# Patient Record
Sex: Female | Born: 1935 | Race: White | Hispanic: No | Marital: Married | Smoking: Never smoker
Health system: Southern US, Community
[De-identification: ages and names within clinical notes are randomized; demographics above are authoritative.]

## PROBLEM LIST (undated history)

## (undated) DIAGNOSIS — I10 Essential (primary) hypertension: Secondary | ICD-10-CM

## (undated) DIAGNOSIS — N390 Urinary tract infection, site not specified: Secondary | ICD-10-CM

## (undated) DIAGNOSIS — R279 Unspecified lack of coordination: Secondary | ICD-10-CM

## (undated) DIAGNOSIS — F0280 Dementia in other diseases classified elsewhere without behavioral disturbance: Secondary | ICD-10-CM

## (undated) DIAGNOSIS — K5190 Ulcerative colitis, unspecified, without complications: Secondary | ICD-10-CM

## (undated) DIAGNOSIS — M1990 Unspecified osteoarthritis, unspecified site: Secondary | ICD-10-CM

## (undated) DIAGNOSIS — E870 Hyperosmolality and hypernatremia: Secondary | ICD-10-CM

## (undated) DIAGNOSIS — M6281 Muscle weakness (generalized): Secondary | ICD-10-CM

## (undated) DIAGNOSIS — K219 Gastro-esophageal reflux disease without esophagitis: Secondary | ICD-10-CM

## (undated) HISTORY — DX: Unspecified osteoarthritis, unspecified site: M19.90

## (undated) HISTORY — DX: Urinary tract infection, site not specified: N39.0

## (undated) HISTORY — DX: Dementia in other diseases classified elsewhere, unspecified severity, without behavioral disturbance, psychotic disturbance, mood disturbance, and anxiety: F02.80

## (undated) HISTORY — DX: Ulcerative colitis, unspecified, without complications: K51.90

## (undated) HISTORY — PX: TONSILLECTOMY: SUR1361

## (undated) HISTORY — DX: Essential (primary) hypertension: I10

## (undated) HISTORY — DX: Unspecified lack of coordination: R27.9

## (undated) HISTORY — DX: Gastro-esophageal reflux disease without esophagitis: K21.9

## (undated) HISTORY — DX: Hyperosmolality and hypernatremia: E87.0

## (undated) HISTORY — DX: Muscle weakness (generalized): M62.81

## (undated) HISTORY — PX: OTHER SURGICAL HISTORY: SHX169

---

## 2000-08-29 HISTORY — PX: OTHER SURGICAL HISTORY: SHX169

## 2011-08-17 ENCOUNTER — Inpatient Hospital Stay
Admission: RE | Admit: 2011-08-17 | Discharge: 2015-11-19 | Disposition: A | Discharge status: Other Acute Inpatient Hospital | Payer: Self-pay | Source: Ambulatory Visit | Attending: Internal Medicine | Admitting: Internal Medicine

## 2011-08-17 DIAGNOSIS — R6251 Failure to thrive (child): Principal | ICD-10-CM

## 2012-11-30 ENCOUNTER — Non-Acute Institutional Stay (SKILLED_NURSING_FACILITY): Payer: Medicare Other | Admitting: Internal Medicine

## 2012-11-30 DIAGNOSIS — I10 Essential (primary) hypertension: Secondary | ICD-10-CM

## 2012-11-30 DIAGNOSIS — F0280 Dementia in other diseases classified elsewhere without behavioral disturbance: Secondary | ICD-10-CM

## 2012-11-30 DIAGNOSIS — H6120 Impacted cerumen, unspecified ear: Secondary | ICD-10-CM

## 2012-11-30 DIAGNOSIS — K219 Gastro-esophageal reflux disease without esophagitis: Secondary | ICD-10-CM

## 2012-11-30 DIAGNOSIS — G3183 Dementia with Lewy bodies: Secondary | ICD-10-CM

## 2012-11-30 DIAGNOSIS — H6123 Impacted cerumen, bilateral: Secondary | ICD-10-CM

## 2012-11-30 DIAGNOSIS — D649 Anemia, unspecified: Secondary | ICD-10-CM | POA: Insufficient documentation

## 2012-11-30 DIAGNOSIS — K5190 Ulcerative colitis, unspecified, without complications: Secondary | ICD-10-CM

## 2012-11-30 NOTE — Progress Notes (Signed)
Patient ID: Theresa Ford, female   DOB: [birth year], 79 y.o.   MRN: [id number] Chief complaint medical management of dementia hypertension ulcerative colitis GERD anemia-acute visit secondary to question wax buildup in the ears.  History of present illness.  Patient is a pleasant elderly resident with a history of significant dementia.  Nonetheless her stay here has been quite unremarkable and uneventful.  She does have severe dementia-Aricept and Namenda have been discontinued in the past because  thought not to be effective  She continues to be fairly remarkably stable.  Does have a history of ulcerative colitis-she is on medication for this and this has not really been an issue.  Also listed history of hypertension but she is not on any medications and blood pressures appear to be well controlled most recently 139/75-113/71 in this range-her weight continues to be relatively stable  She did have some weight gain up till earlier this year she has lost a bit of weight the last couple months -- we will have to keep an eye on this the baseline appears to be low to mid 160s currently 161.4  Most acute issue has been apparently some increased difficulty hearing-her husband who is very attentive think she may have some ear wax and I will look into this.  Patient does not complaining of any ear pain shortness of breath or chest pain-nursing staff does not report any of this either-apparently she eats fairly well.  Family medical social history has been reviewed per history and physical on 08/24/2011.  Medications reviewed per MAR.  Review of systems-stated in history of present illness again largely un attainable secondary to severe dementia  Physical exam.  Temperature is 97.4 pulse 68 respirations 18 blood pressure 139/75-113/71-weight is 161.4.  General this is a frail but fairly vigorous elderly female in no distress sitting comfortably in her wheelchair.  Her skin is warm and dry-she  does appear to have numerous seb  keratoses which is baseline  Eyes pupils empirical round reactive to light sclera and conjunctiva are clear.  Oropharynx somewhat limited exam since patient did not really open her mouth very wide but mucous membranes do appear moist-I do not see any evidence of a film on the tongue etc  Ears-there appears to be wax obstructing the tympanic membrane in both ears- I do not see any erythema or drainage.  Chest -- clear to auscultation without rhonchi rales or wheezes no labored breathing.  There is somewhat poor respiratory effort.  Heart is regular rate and rhythm without murmur gallop or rub.  Abdomen soft nontender positive bowel sounds.  Muscle skeletal-ambulating in a wheelchair-she does actually walk at times with assistance-.  Neurologic as she does have severe dementia-cranial nerves appear grossly intact-I do not see any lateralizing findings.  Psych-again she does have severe dementia-has difficulty following verbal commands.  Pleasant smiling which appears to be her baseline.  Labs.  10/04/2012.  CBC 8.0 hemoglobin 12.5 platelets 307.  Sodium 139 potassium 4 BUN 14 creatinine 0.90.  06/18/1911.  Liver function tests within normal limits    Assessment and plan.  #1-dementia-this continues to be at baseline-she is functioning well in this setting-has a very supportive family  #2-hypertension-this appears stable currently on no medications.  #3 ulcerative colitis-continues to be stable on current medicine.  #4 GERD-continues on Prilosec this does not appear to be an issue recently.  #5-allergic rhinitis -continues on Claritin.--This is stable and Claritin appears to help.  #6-ear impaction-Will treat with the Debrox  10 drops each ear twice a day for 3 days and then flush with warm water on day 4  If  -this is not effective consider ENT consult  [phone]

## 2013-01-02 ENCOUNTER — Non-Acute Institutional Stay (SKILLED_NURSING_FACILITY): Payer: Medicare Other | Admitting: Internal Medicine

## 2013-01-02 DIAGNOSIS — R339 Retention of urine, unspecified: Secondary | ICD-10-CM

## 2013-01-07 ENCOUNTER — Non-Acute Institutional Stay (SKILLED_NURSING_FACILITY): Payer: Medicare Other | Admitting: Internal Medicine

## 2013-01-07 DIAGNOSIS — R339 Retention of urine, unspecified: Secondary | ICD-10-CM

## 2013-01-07 DIAGNOSIS — K5641 Fecal impaction: Secondary | ICD-10-CM

## 2013-01-07 NOTE — Progress Notes (Signed)
Patient ID: Theresa Ford, female   DOB: 30-May-1927, 79 y.o.   MRN: [id number] Chief complaint; followup urinary retention.  History; Mrs.-looking was recently discovered by the staff to have difficulty voiding. She had a Foley catheter placed last weel. Lateral sound showed over 400 cc. There was a urine culture done that was apparently negative although I can't see that result. When I examined her last week. I discovered her to have a moderate fecal impaction, which I have cleared, she is now at the point at to see if she if we can consider pulling the catheter.  Review of systems; not possible from the patient however, nursing reports she has been having a good series of bowel movements  Physical exam; GI still somewhat distended, but no masses, bowel. sounds are positive. No liver no spleen. GU Foley catheter is employed. Rectal I did not repeat the exam today, over. She is apparently been having good bowel movements on regular Dulcolax suppository.   Impression/plan #1 urinary retention; hopefully, secondary to fecal impaction now resolved. I'm going to pull the Foley catheter tomorrow for a voiding trial. Ultimately, she will need probably a repeat bladder ultrasound. #2 fecal impaction hopefully resolve, and hopefully, the cause of the urinary retention.

## 2013-01-08 NOTE — Progress Notes (Signed)
Patient ID: Theresa Ford, female   DOB: 1926/11/08, 79 y.o.   MRN: [id number]          PROGRESS NOTE  DATE:  01/02/2013  FACILITY: [facility]   LEVEL OF CARE:   SNF   Acute Visit   CHIEF COMPLAINT:  Urinary retention.   HISTORY OF PRESENT ILLNESS:   Apparently, the patient was noted not to void.  She had a Foley catheter placed and over 600 cc of urine was obtained.  The urinalysis looks fairly benign.  A urine culture is pending.  The patient is really too demented to provide further history.  She does not have an overt history of urinary retention.  She is not on any of the usual offending medications.    REVIEW OF SYSTEMS:  Not otherwise possible.  She is not febrile.   PHYSICAL EXAMINATION:   GASTROINTESTINAL:  ABDOMEN:  Distended, although I feel no clear masses.     RECTAL:  Notable for a large amount of soft stool.   GENITOURINARY:  BLADDER:   She has a Foley catheter in place.  There is no clear suprapubic or costovertebral angle tenderness.    ASSESSMENT/PLAN:  Urinary retention.  The cause of this is not clear.  We are awaiting a urine culture.  She does have a moderate amount of soft stool.  Occasionally, I have seen fecal impaction cause urinary retention in older people and I will clear this out.  She is on none of the usual medication offenders.  I will wait for the culture result, clear the impaction, and then attempt to pull the Foley catheter.  Failing this, she may need to see Urology.  CPT CODE: 40981

## 2013-02-05 ENCOUNTER — Non-Acute Institutional Stay (SKILLED_NURSING_FACILITY): Payer: Medicare Other | Admitting: Internal Medicine

## 2013-02-05 DIAGNOSIS — F0280 Dementia in other diseases classified elsewhere without behavioral disturbance: Secondary | ICD-10-CM

## 2013-02-05 DIAGNOSIS — K5190 Ulcerative colitis, unspecified, without complications: Secondary | ICD-10-CM

## 2013-02-05 DIAGNOSIS — K219 Gastro-esophageal reflux disease without esophagitis: Secondary | ICD-10-CM

## 2013-02-05 DIAGNOSIS — R34 Anuria and oliguria: Secondary | ICD-10-CM | POA: Insufficient documentation

## 2013-02-05 DIAGNOSIS — I10 Essential (primary) hypertension: Secondary | ICD-10-CM

## 2013-02-05 DIAGNOSIS — G309 Alzheimer's disease, unspecified: Secondary | ICD-10-CM | POA: Insufficient documentation

## 2013-02-05 DIAGNOSIS — D649 Anemia, unspecified: Secondary | ICD-10-CM

## 2013-02-05 NOTE — Progress Notes (Signed)
Patient ID: Theresa Ford, female   DOB: 10-03-1926, 79 y.o.   MRN: [id number] This is a routine visit-acute visit.  Level of care skilled.  Facility [facility].   Chief complaint medical management of dementia hypertension ulcerative colitis GERD anemia-acute visit secondary to decreased urine output today .  History of present illness.  Patient is a pleasant elderly resident with a history of significant dementia.  Nonetheless her stay here has been quite unremarkable and uneventful.   She does have severe dementia-Aricept and Namenda have been discontinued in the past because thought not to be effective  She continues to be fairly remarkably stable.  Does have a history of ulcerative colitis-she is on medication for this and this has not really been an issue.  Also listed history of hypertension but she is not on any medications and blood pressures appear to be well controlled most recently 139/85-113/71  in this range-her weight continues to be relatively stable  She did have some weight gain up till earlier this year she has lost a bit of weight the last couple months -- we will have to keep an eye on this the baseline appears to be low to mid 160s currently 164  Most acute issue has been apparently some decreased urinary output today-her tech says she hasn't had much at all she usually does. She is not complaining of any acute dysuria or suprapubic pain although she is a poor historian    .  Patient does not complaining of any ear pain shortness of breath or chest pain-nursing staff does not report any of this either-apparently she eats fairly well. And is having regular bowel movements-apparently had been urinating quite well up till this morning   Family medical social history has been reviewed per history and physical on 08/24/2011.   Medications reviewed per MAR.   Review of systems-stated in history of present illness again largely un attainable secondary to severe dementia    Physical exam She is afebrile pulse is 72 respirations 18 blood pressure 139/85-123/80-113/71-weight is stable at 164.  Marland Kitchen  General this is a frail but fairly vigorous elderly female in no distress--lying comfortably in bed.  Her skin is warm and dry-she does appear to have numerous seb keratoses which is baseline  Eyes pupils empirical round reactive to light sclera and conjunctiva are clear.  Oropharynx somewhat limited exam since patient did not really open her mouth very wide but mucous membranes do appear moist-I do not see any evidence of a film on the tongue etc   .  Chest -- clear to auscultation without rhonchi rales or wheezes no labored breathing.  There is somewhat poor respiratory effort.  Heart is regular rate and rhythm without murmur gallop or rub.  Abdomen soft nontender positive bowel sounds.  GU-there appears to possibly be some mild suprapubic distention-I do not really appreciate a lot of tenderness here Muscle skeletal-do not note any deformities--she does actually walk at times with assistance-.  Neurologic as she does have severe dementia-cranial nerves appear grossly intact-I do not see any lateralizing findings.  Psych-again she does have severe dementia-has difficulty following verbal commands.  Pleasant smiling which appears to be her baseline .  Labs.  10/04/2012.  CBC 8.0 hemoglobin 12.5 platelets 307.  Sodium 139 potassium 4 BUN 14 creatinine 0.90.  06/18/2012.  Liver function tests within normal limits   Assessment and plan.  #1-dementia-this continues to be at baseline-she is functioning well in this setting-has a very supportive family  #  2-hypertension-this appears stable currently on no medications.  #3 ulcerative colitis-continues to be stable on current medicine.  #4 GERD-continues on Prilosec this does not appear to be an issue recently.  #5-allergic rhinitis -continues on Claritin.--This is stable and Claritin appears to help.  #6-question  urinary retention-will order an in and out cath and keep catheter in if greater than 250 cc obtained-also check a UA Cand S Of note also will obtain updated lab work including a CBC as well as CMP  Addendum-nursing did insert a catheter and obtained  greater than 300 cc of urine-for now will keep catheter in and obtain lab work as noted above    [id number]

## 2013-02-06 ENCOUNTER — Non-Acute Institutional Stay (SKILLED_NURSING_FACILITY): Payer: Medicare Other | Admitting: Internal Medicine

## 2013-02-06 DIAGNOSIS — R339 Retention of urine, unspecified: Secondary | ICD-10-CM

## 2013-02-08 ENCOUNTER — Non-Acute Institutional Stay (SKILLED_NURSING_FACILITY): Payer: Medicare Other | Admitting: Internal Medicine

## 2013-02-08 DIAGNOSIS — N390 Urinary tract infection, site not specified: Secondary | ICD-10-CM

## 2013-02-08 DIAGNOSIS — R34 Anuria and oliguria: Secondary | ICD-10-CM

## 2013-02-08 NOTE — Progress Notes (Signed)
Patient ID: Theresa Ford, female   DOB: 07/01/1927, 79 y.o.   MRN: [id number]  This is an acute visit.  Level of care skilled.  Facilities [facility].  Chief complaint-acute visit followup urinary retention-UTI.  History of present illness.  Patient is a pleasant elderly resident with a history of significant dementia.  I saw her earlier this week secondary to staff noting decreased urinary output-an in and out cath did yield  300 cc of urine--an indwelling Foley catheter was inserted and a urine culture was obtained which has grown out 85,000 colonies of Escherichia coli.  She has been afebrile according to nursing staff she appears to be at her baseline which is confused but pleasantly so.  Family medical social history has been reviewed her history and physical on 08/24/2011.  Medications have been reviewed per MAR.  Review of systems-please see history of present illness essentially unattainable secondary to severe dementia.  Physical exam.  He is afebrile pulse 72 respirations of 18.  In general this is a frail elderly female in no distress lying comfortably in bed.  Skin is warm and dry she does have numerous seborrheic keratoses.  Heart is regular rate and rhythm without murmur gallop or rub.  Chest is clear to auscultation with somewhat poor respiratory effort.  Abdomen soft nontender with positive bowel sounds.  GU-could not really appreciate any distention this time --some mild suprapubic tenderness.  Foley catheter is draining amber colored urine.  Labs.  Urine culture did grow out 85,000 colonies of Escherichia coli.  02/06/2013.  WBC 5.4 hemoglobin 12.5 platelets 253.  Sodium 140 potassium 3.7 BUN 14 creatinine 0.90.  Liver function tests within normal limits except albumin of 3.4.  Assessment and plan.  #1-urinary retention with possible UTI-patient appears to be somewhat symptomatic here with recent urinary retention and  mild suprapubic tenderness-Will  treat with a short course of Macrobid 100 mg twice a day for 7 days-also her husband is quite concerned about the catheter and would like it removed as soon as possible-will write order to remove this tomorrow and do a bladder scan post void--certainly monitor for any continued retention.  [id number].  Marland Kitchen

## 2013-02-18 NOTE — Progress Notes (Signed)
Patient ID: Theresa Ford, female   DOB: [birth year], 79 y.o.   MRN: [id number]           PROGRESS NOTE  DATE:  02/06/2013  FACILITY: [facility]   LEVEL OF CARE:   SNF   Acute Visit   CHIEF COMPLAINT:  Urinary retention.    HISTORY OF PRESENT ILLNESS:   About a month ago, I saw Theresa Ford when she was noted by the staff to have trouble voiding.  She had a Foley catheter placed.  Bladder ultrasound showed over 400 cc.  There was a urine culture done that was negative.  Examination at that time suggested fecal impaction.  Although this is, in my experience, a rare cause for the urinary retention, we cleared that.  She had the Foley catheter removed and she appeared to be voiding adequately.  Unfortunately, it does not appear that I reordered a bladder ultrasound after this event.  In any case, on 02/05/2013, she was noted to have not voided during an entire shift.  A Foley catheter was inserted.  She  apparently had 300 cc which is not a tremendous amount, and it was not really clear that she was uncomfortable.  Paradoxically, we have had recent lab work that does not show any evidence of dehydration.  Her CBC, comprehensive metabolic panel are normal other than an albumin of 3.4.    PHYSICAL EXAMINATION:   GENERAL APPEARANCE:  The patient is not in any distress.   CARDIOVASCULAR:  CARDIAC:   Heart sounds are normal.  She does not appear to be dehydrated.   GASTROINTESTINAL:  LIVER/SPLEEN/KIDNEYS:  No liver, no spleen.  ABDOMEN:  No masses, although she is somewhat distended.   GENITOURINARY:  BLADDER:   No suprapubic or costovertebral angle tenderness.  Foley catheter is in place.   RECTAL:  Exam showed an empty rectal vault.  She is not impacted.    ASSESSMENT/PLAN:  Question urinary retention.  300 cc does not diagnose urinary retention.  At some point, this is going to need to be pulled and give her a chance to see if she can void with a bladder ultrasound after the  catheter is out.  I will also send another urine for C&S today.  CPT CODE: 40981

## 2013-03-14 ENCOUNTER — Non-Acute Institutional Stay (SKILLED_NURSING_FACILITY): Payer: Medicare Other | Admitting: Internal Medicine

## 2013-03-14 DIAGNOSIS — L309 Dermatitis, unspecified: Secondary | ICD-10-CM

## 2013-03-14 DIAGNOSIS — D649 Anemia, unspecified: Secondary | ICD-10-CM

## 2013-03-14 DIAGNOSIS — I10 Essential (primary) hypertension: Secondary | ICD-10-CM

## 2013-03-14 DIAGNOSIS — K5190 Ulcerative colitis, unspecified, without complications: Secondary | ICD-10-CM

## 2013-03-14 DIAGNOSIS — R34 Anuria and oliguria: Secondary | ICD-10-CM

## 2013-03-14 DIAGNOSIS — K219 Gastro-esophageal reflux disease without esophagitis: Secondary | ICD-10-CM

## 2013-03-14 DIAGNOSIS — F0280 Dementia in other diseases classified elsewhere without behavioral disturbance: Secondary | ICD-10-CM

## 2013-03-14 DIAGNOSIS — L259 Unspecified contact dermatitis, unspecified cause: Secondary | ICD-10-CM

## 2013-03-14 DIAGNOSIS — G309 Alzheimer's disease, unspecified: Secondary | ICD-10-CM

## 2013-03-14 NOTE — Progress Notes (Signed)
Patient ID: Theresa Ford, female   DOB: September 25, 1926, 79 y.o.   MRN: [id number]                     Chief complaint medical management of dementia hypertension ulcerative colitis GERD anemia  Urinary retention...-acute visit secondary to buttock rash... .   History of present illness.   Patient is a pleasant elderly resident with a history of significant dementia.   Nonetheless her stay here has been quite unremarkable and uneventful. More recently she did develop some urinary retention--she has had a Foley catheter at times-and was treated for a UTI.  She currently does not have a catheter according to nursing staff and family she does retain her urine for significant parts of the day and then will have a very large amount of urinary output-    She does have severe dementia-Aricept and Namenda have been discontinued in the past because thought not to be effective   She also has developed apparently a well demarcated erythematous area on her left buttocks-this has been assessed by wound care and appears to steroid cream was started yesterday-according to nursing today this is looking a bit better.   Does have a history of ulcerative colitis-she is on medication for this and this has not really been an issue.   Also listed history of hypertension but she is not on any medications and blood pressures appear to be well controlled most recently 136/84-118/73-151/85-does not appear to have any consistent elevations  in -her weight continues to be relatively stable   She did have some weight gain up till earlier this year she has lost a bit of weight the last couple months -- we will have to keep an eye on this the baseline appears to be low to mid 160s currently 161    . She is not complaining of any acute dysuria or suprapubic pain although she is a poor historian           Family medical social history has been reviewed per history and physical on 08/24/2011.    Medications  reviewed per MAR.    Review of systems-stated in history of present illness again largely un attainable secondary to severe dementia    Physical exam  She is afebrile pulse 78 respirations 20 blood pressure as noted in history of present illness weight is relatively stable at 161.   Marland Kitchen   General this is a frail but fairly vigorous elderly female in no distress--lying comfortably in bed.   Her skin is warm and dry-she does appear to have numerous seb keratoses which is baseline Left buttocks there is a well demarcated somewhat pale erythematous area there is no firmness pain or elevation to this-there is no warmth-   Eyes pupils empirical round reactive to light sclera and conjunctiva are clear.   Oropharynx somewhat limited exam since patient did not really open her mouth very wide but mucous membranes do appear moist-I do not see any evidence of a film on the tongue etc    .   Chest -- clear to auscultation without rhonchi rales or wheezes no labored breathing.   There is somewhat poor respiratory effort.   Heart is regular rate and rhythm without murmur gallop or rub.   Abdomen soft nontender positive bowel sounds.   GU-does not appear to have any suprapubic tenderness or gross distention Muscle skeletal-do not note any deformities--she does actually walk at times with assistance-.   Neurologic as she  does have severe dementia-cranial nerves appear grossly intact-I do not see any lateralizing findings.   Psych-again she does have severe dementia-has difficulty following verbal commands.   Pleasant smiling which appears to be her baseline .   Labs.    10/04/2012.   CBC 8.0 hemoglobin 12.5 platelets 307.   Sodium 139 potassium 4 BUN 14 creatinine 0.90.   06/18/2012.   Liver function tests within normal limits    Assessment and plan.   #1-dementia-this continues to be at baseline-she is functioning well in this setting-has a very supportive family   #2-hypertension-this appears  stable currently on no medications.   #3 ulcerative colitis-continues to be stable on Sulfasalazine    #4 GERD-continues on Prilosec this does not appear to be an issue recently.   #5-allergic rhinitis -continues on Claritin.--This is stable and Claritin appears to help.   #6-question urinary retention-apparently she is voiding although apparently she does this at sporadic times-her family really desires her not to have a Foley if at all possible-we'll continue to monitor- #7-dermatitis unspecified-at this point we'll continue the steroid cream since apparently this is improving-however if this is not resolved fairly quickly would switch to an antifungal  Of note also will obtain updated lab work including a CBC as well as CMP       [id number]

## 2013-06-13 ENCOUNTER — Non-Acute Institutional Stay (SKILLED_NURSING_FACILITY): Payer: Medicare Other | Admitting: Internal Medicine

## 2013-06-13 DIAGNOSIS — D649 Anemia, unspecified: Secondary | ICD-10-CM

## 2013-06-13 DIAGNOSIS — R350 Frequency of micturition: Secondary | ICD-10-CM

## 2013-06-13 DIAGNOSIS — K219 Gastro-esophageal reflux disease without esophagitis: Secondary | ICD-10-CM

## 2013-06-13 DIAGNOSIS — I10 Essential (primary) hypertension: Secondary | ICD-10-CM

## 2013-06-13 DIAGNOSIS — N390 Urinary tract infection, site not specified: Secondary | ICD-10-CM

## 2013-06-13 DIAGNOSIS — K5190 Ulcerative colitis, unspecified, without complications: Secondary | ICD-10-CM

## 2013-06-13 DIAGNOSIS — F0280 Dementia in other diseases classified elsewhere without behavioral disturbance: Secondary | ICD-10-CM

## 2013-06-13 NOTE — Progress Notes (Signed)
Patient ID: Theresa Ford, female   DOB: March 28, 1927, 79 y.o.   MRN: [id number]    this is a routine visit.  Level of care skilled.  Facility [facility].   Chief complaint medical management of dementia hypertension ulcerative colitis GERD anemia Urinary retention...-acute visit secondary urinary frequency -UTI...  .  History of present illness.  Patient is a pleasant elderly resident with a history of significant dementia.  Nonetheless her stay here has been quite unremarkable and uneventful.  More recently she did develop some urinary retention--she has had a Foley catheter at times-and was treated for a UTI.--According to nursing staff she has been voiding fairly freely for some time now.  However she has had some increased frequency recently and a urine culture was obtained which has grown out greater than 100,000 colonies of Escherichia coli.   -  She does have severe dementia-Aricept and Namenda have been discontinued in the past because thought not to be effective   .  Does have a history of ulcerative colitis-she is on medication for this and this has not really been an issue.  Also listed history of hypertension but she is not on any medications and blood pressure  appears to be well controlled I got 120/70 manually today  She did have some weight gain up till earlier this year she has lost a bit of weight the last couple months --this appears to have stabilized hovering around 160-- we will have to keep an eye on this the baseline appears to be low to mid 160s   .    Family medical social history has been reviewed per history and physical on 08/24/2011 .  Medications reviewed per MAR.   Review of systems-stated in history of present illness again largely un attainable secondary to severe dementia --please see history of present illness-- apparently she has been at her baseline no complaints of chest pain shortness of breath--apparently increased frequency of urine has been  noted Physical exam   She is afebrile pulse of 82 respirations 17 blood pressure 120/70 weight stable at 161  .  Marland Kitchen  General this is a frail but fairly vigorous elderly female in no distress--sitting in her wheelchair.  Her skin is warm and dry-she does appear to have numerous seb keratoses which is baseline   -  Eyes pupils equal round reactive to light sclera and conjunctiva are clear.  Oropharynx somewhat limited exam since patient did not really open her mouth very wide but mucous membranes do appear moist-I do not see any evidence of a film on the tongue etc  .  Chest -- clear to auscultation without rhonchi rales or wheezes no labored breathing.  There is somewhat poor respiratory effort.  Heart is regular rate and rhythm without murmur gallop or rub.  Abdomen soft nontender positive bowel sounds.  GU-I did not note any drainage from the area-or suprapubic tenderness although somewhat difficult to tell secondary to patient's severe dementia Muscle skeletal-do not note any deformities--she does actually walk at times with assistance-.  Neurologic as she does have severe dementia-cranial nerves appear grossly intact-I do not see any lateralizing findings.  Psych-again she does have severe dementia-has difficulty following verbal commands.  Pleasant smiling which appears to be her baseline  .  Labs  July   21st 2014.  WBC 5.2 hemoglobin 11.9 platelets 259.  Sodium 141 potassium 3.8 BUN 16 creatinine 1.02.  Liver function tests within normal limits except albumin of 3.2.    10/04/2012.  CBC 8.0 hemoglobin 12.5 platelets 307.  Sodium 139 potassium 4 BUN 14 creatinine 0.90.  06/18/2012.  Liver function tests within normal limits  Assessment and plan. #1-UTI-I have reviewed sensitivities she does have a penicillin allergy will start her on Macrobid 100 mg twice a day for 7 days with probiotic twice a day for 7 days have pharmacy dose for renal function  #2-dementia-this  continues to be at baseline-she is functioning well in this setting-has a very supportive family  #3-hypertension-this appears stable currently on no medications.  #4 ulcerative colitis-continues to be stable on Sulfasalazine  #5 GERD-continues on Prilosec this does not appear to be an issue recently.  #6-allergic rhinitis -continues on Claritin.--This is stable and Claritin appears to help  [id number].

## 2013-06-17 ENCOUNTER — Non-Acute Institutional Stay (SKILLED_NURSING_FACILITY): Payer: Medicare Other | Admitting: Internal Medicine

## 2013-06-17 DIAGNOSIS — R8271 Bacteriuria: Secondary | ICD-10-CM

## 2013-06-17 DIAGNOSIS — R82998 Other abnormal findings in urine: Secondary | ICD-10-CM

## 2013-06-17 NOTE — Progress Notes (Signed)
Patient ID: Theresa Ford, female   DOB: 1927-05-19, 79 y.o.   MRN: [id number] Facility; [facility]  Chief complaint;  Question Escherichia coli UTI Chief complaint; I was asked to look at this patient today by her son Dr.Rocco. Apparently she had a urine culture done on October 14 for a suggestion of a UTI based on possible malodorous urine although our note from that time suggest urinary frequency. The culture grew greater than 100,000 colonies per ml of Escherichia coli. This is quinolone resistant, sulfa resistant, ampicillin resistant. She was put on nitrofurantoin. Currently they're also concerns raised by pharmacy based on her GFR although her BUN and creatinine are within the normal range. Latter issue is not an issue as far as I am concerned that as nitrofurantoin works well in older people who have for a borderline GFRs, however the issue of asymptomatic bacteriuria has been raised by her son which I think is a legitimate concern.  In talking to the nursing staff was present today they see her as in her baseline state with no obvious change it. She has not been systemically unwell. She has been voiding well eating and drinking per her norm.   Physical examination GU there is no evidence of urinary retention which this patient has had in the past. There is no suprapubic or costovertebral angle tenderness GI no liver no spleen no tenderness and no masses although there was some mild distention her bowel sounds are positive  Impression/plan #1 asymptomatic bacteriuria culturing Escherichia coli. I see no obvious reason for antibiotics in this case her. Her son is raised a concern about antibiotic associated diarrhea in the past when the patient was in [place]. Although she was never formally but diagnosed with pseudomembranous colitis. This is an issue however I think the bigger issue here is the complete absence of symptoms or signs that would suggest a legitimate UTI. I will  stop the nitrofurantoin at this point although I have no specific concerns about its use in the future if necessary

## 2013-08-15 ENCOUNTER — Non-Acute Institutional Stay (SKILLED_NURSING_FACILITY): Payer: Medicare Other | Admitting: Internal Medicine

## 2013-08-15 DIAGNOSIS — L309 Dermatitis, unspecified: Secondary | ICD-10-CM

## 2013-08-15 DIAGNOSIS — R05 Cough: Secondary | ICD-10-CM

## 2013-08-15 DIAGNOSIS — K219 Gastro-esophageal reflux disease without esophagitis: Secondary | ICD-10-CM

## 2013-08-15 DIAGNOSIS — L259 Unspecified contact dermatitis, unspecified cause: Secondary | ICD-10-CM

## 2013-08-15 DIAGNOSIS — K5190 Ulcerative colitis, unspecified, without complications: Secondary | ICD-10-CM

## 2013-08-15 DIAGNOSIS — I10 Essential (primary) hypertension: Secondary | ICD-10-CM

## 2013-08-15 DIAGNOSIS — F0280 Dementia in other diseases classified elsewhere without behavioral disturbance: Secondary | ICD-10-CM

## 2013-08-15 NOTE — Progress Notes (Signed)
Patient ID: Theresa Ford, female   DOB: 1927-03-29, 79 y.o.   MRN: [id number]  \ Her vital signs are temperature 98.0 pulse 68 respirations 20 blood pressure 121/74-O2 saturation is 99% on room air her weight is stable at 163

## 2013-08-15 NOTE — Progress Notes (Signed)
Patient ID: Theresa Ford, female   DOB: [birth year], 79 y.o.   MRN: [id number] this is a routine visit.  Level of care skilled.  Facility [facility].  Chief complaint medical management of dementia hypertension ulcerative colitis GERD anemia Urinary retention...-acute visit secondary to cough...  .  History of present illness.  Patient is a pleasant elderly resident with a history of significant dementia.  Nonetheless her stay here has been quite unremarkable and uneventful.  More recently she did develop some urinary retention--she has had a Foley catheter at times-and was treated for a UTI.--According to nursing staff she has been voiding fairly freely for some time now.   A couple months ago it was thought she was having increased urinary frequency --r she was started on Macrobid but this was DC'd several days later when it was thought that she was then largely asymptomatic and  family brought up concerns about previous episodes of diarrhea with prolonged antibiotics -  -  She does have severe dementia-Aricept and Namenda have been discontinued in the past because thought not to be effective  .  Does have a history of ulcerative colitis-she is on medication for this and this has not really been an issue.  Also listed history of hypertension but she is not on any medications and blood pressure appears to be well controlled   She did have some weight gain up till earlier this year  then she has lost a bit of weight  --this appears to have stabilized hovering around 160-- we will have to keep an eye on this the baseline appears to be low to mid 160s  Acute issue this evening as a cough-according to her husband who is very attentive this has been going on for about 4 days and is nonproductive.  He thinks the cough is getting somewhat better and is in his words "breaking up   she also has had some itching of her lower back that she's been getting  Sarna-apparently this is not totally effective wound care  and nursing has talked with her husband and they will try a mixture  with hydrocortisone and see how that does"  .  Family medical social history has been reviewed per history and physical on 08/24/2011  .  Medications reviewed per MAR.  Review of systems-stated in history of present illness again largely un attainable secondary to severe dementia --please see history of present illness-- apparently she has been at her baseline no complaints of chest pain shortness of breath--her urinary issues have been stable She has had a nonproductive cough for several days as noted above  d  Physical exam    .  .  General this is a frail but fairly vigorous elderly female in no distress--sitting in her wheelchair.  Her skin is warm and dry-she does appear to have numerous seb keratoses which is baseline--do not really note a  rash of her lower back apparently there is some itching here   -  Eyes pupils equal round reactive to light sclera and conjunctiva are clear.  Oropharynx somewhat limited exam since patient did not really open her mouth very wide but mucous membranes do appear moist-I do not see any evidence of a film on the tongue etc  .  Chest -- clear to auscultation without rhonchi rales or wheezes no labored breathing.  t poor respiratory effort--is not really follow verbal commands this evening.  Heart is regular rate and rhythm without murmur gallop or rub.  Abdomen soft  nontender positive bowel sounds.     Muscle skeletal-do not note any deformities--she has actually walked at times with assistance-.  Neurologic as she does have severe dementia-cranial nerves appear grossly intact-I do not see any lateralizing findings.  Psych-again she does have severe dementia-has difficulty following verbal commands.  Pleasant smiling which appears to be her baseline  .  Labs  06/14/2013.  Sodium 136 potassium 3.8 BUN 18 creatinine 0.89   July 21st 2014.  WBC 5.2 hemoglobin 11.9 platelets 259.   Sodium 141 potassium 3.8 BUN 16 creatinine 1.02.  Liver function tests within normal limits except albumin of 3.2.  10/04/2012.  CBC 8.0 hemoglobin 12.5 platelets 307.  Sodium 139 potassium 4 BUN 14 creatinine 0.90.  06/18/2012.  Liver function tests within normal limits   Assessment and plan.  #1-cough-physical exam was fairly benign vital signs are stable-I did discuss this with her husband-Will treat with Mucinex 600 mg twice a day for 5 days to see if this will help her become more productive also monitor  vital signswith pulse ox twice a day for 3 days Her husband who has been following this feels it is getting somewhat better but would like to keep a close eye on this--  #2-dementia-this continues to be at baseline-she is functioning well in this setting-has a very supportive family  #3-hypertension-this appears stable currently on no medications.  #4 ulcerative colitis-continues to be stable on Sulfasalazine  #5 GERD-continues on Prilosec this does not appear to be an issue recently.  #6-allergic rhinitis -continues on Claritin.--This is stable and Claritin appears to help #7-insomnia-she is on trazodone as needed. #8-dermatitis unspecified-again will try a hydrocortisone mix to her lower back and see if this helps with the itchingr  [id number].

## 2013-11-05 ENCOUNTER — Non-Acute Institutional Stay (SKILLED_NURSING_FACILITY): Payer: Medicare Other | Admitting: Internal Medicine

## 2013-11-05 DIAGNOSIS — I10 Essential (primary) hypertension: Secondary | ICD-10-CM

## 2013-11-05 DIAGNOSIS — G309 Alzheimer's disease, unspecified: Secondary | ICD-10-CM

## 2013-11-05 DIAGNOSIS — F0280 Dementia in other diseases classified elsewhere without behavioral disturbance: Secondary | ICD-10-CM

## 2013-11-05 DIAGNOSIS — D649 Anemia, unspecified: Secondary | ICD-10-CM

## 2013-11-05 DIAGNOSIS — K219 Gastro-esophageal reflux disease without esophagitis: Secondary | ICD-10-CM

## 2013-11-05 NOTE — Progress Notes (Signed)
Patient ID: Theresa BarreBettie Maertens, female   DOB: [birth year], 80 y.o.   MRN: [id number]     Theresa Ford  1  this is a routine visit.   Level of care skilled.   [facility]    Chief complaint medical management of dementia hypertension ulcerative colitis GERD anemia Urinary retention......   .   History of present illness.   Patient is a pleasant elderly resident with a history of significant dementia.   Nonetheless her stay here has been quite unremarkable and uneventful.   Late last year she did develop some urinary retention--she has had a Foley catheter at times-and was treated for a UTI.--According to nursing staff she has been voiding fairly freely for some time now.    months ago  it was thought she was having increased urinary frequency --r she was started on Macrobid but this was DC'd several days later when it was thought that she was then largely asymptomatic and  family brought up concerns about previous episodes of diarrhea with prolonged antibiotics -   -   She does have severe dementia-Aricept and Namenda have been discontinued in the past because thought not to be effective   .   Does have a history of ulcerative colitis-she is on medication for this and this has not really been an issue.   Also listed history of hypertension but she is not on any medications and blood pressure appears to be well controlled --taken manually today was 110/60-I see a listed 1 of 154/86 but this appears to be out of the norm   She did have some weight gain up till earlier this year  then she has lost a bit of weight  --this appears to have stabilized hovering around 160-- we will have to keep an eye on this the baseline appears to be low to mid 160s  "   .   Family medical social history has been reviewed per history and physical on 08/24/2011   .   Medications reviewed per MAR.    Review of systems-stated in history of present illness again largely un attainable secondary to severe dementia  --please see history of present illness-- apparently she has been at her baseline no complaints of chest pain shortness of breath--her urinary issues have been stable      Physical exam    Temperature 97.8 pulse 64 respirations 18 blood pressure 110/60 weight is stable at 160.6-O2 saturation 98% on room air    .   Marland Kitchen.   General this is a frail but fairly vigorous elderly female in no distress--lying in bed .   Her skin is warm and dry-she does appear to have numerous seb keratoses which is baseline--   -   Eyes pupils equal round reactive to light sclera and conjunctiva are clear.   Oropharynx somewhat limited exam since patient did not really open her mouth very wide but mucous membranes do appear moist-I do not see any evidence of a film on the tongue etc   .   Chest -- clear to auscultation without rhonchi rales or wheezes no labored breathing.   t poor respiratory effort--is not really follow verbal commands   Heart is regular rate and rhythm without murmur gallop or rub.   Abdomen soft nontender positive bowel sounds.       Muscle skeletal-do not note any deformities--she has actually walked at times with assistance-.   Neurologic as she does have severe dementia-cranial nerves appear grossly intact-I do  not see any lateralizing findings.   Psych-again she does have severe dementia-has difficulty following verbal commands.   Pleasant smiling which appears to be her baseline   .   Labs   06/14/2013.   Sodium 136 potassium 3.8 BUN 18 creatinine 0.89    July 21st 2014.   WBC 5.2 hemoglobin 11.9 platelets 259.   Sodium 141 potassium 3.8 BUN 16 creatinine 1.02.   Liver function tests within normal limits except albumin of 3.2.   10/04/2012.   CBC 8.0 hemoglobin 12.5 platelets 307.   Sodium 139 potassium 4 BUN 14 creatinine 0.90.   06/18/2012.   Liver function tests within normal limits    Assessment and plan.    -   #1-dementia-this continues to be at baseline-she is  functioning well in this setting-has a very supportive family --  #2-hypertension-this appears stable currently on no medications.   #3 ulcerative colitis-continues to be stable on Sulfasalazine   #4 GERD-continues on Prilosec this does not appear to be an issue recently.   #5-allergic rhinitis -continues on Claritin.--This is stable and Claritin appears to help #6-insomnia-she is on trazodone as needed. #7-urinary retention-this has not been an issue recently has been quite stable  Of note will order a CBC and comprehensive metabolic panel for updated values clinically she appears stable and at baseline     [id number].

## 2014-03-07 ENCOUNTER — Non-Acute Institutional Stay (SKILLED_NURSING_FACILITY): Payer: Medicare Other | Admitting: Internal Medicine

## 2014-03-07 DIAGNOSIS — G309 Alzheimer's disease, unspecified: Principal | ICD-10-CM

## 2014-03-07 DIAGNOSIS — K5190 Ulcerative colitis, unspecified, without complications: Secondary | ICD-10-CM

## 2014-03-07 DIAGNOSIS — F0280 Dementia in other diseases classified elsewhere without behavioral disturbance: Secondary | ICD-10-CM

## 2014-03-07 DIAGNOSIS — K219 Gastro-esophageal reflux disease without esophagitis: Secondary | ICD-10-CM

## 2014-03-07 DIAGNOSIS — I10 Essential (primary) hypertension: Secondary | ICD-10-CM

## 2014-03-07 DIAGNOSIS — G4700 Insomnia, unspecified: Secondary | ICD-10-CM

## 2014-03-07 NOTE — Progress Notes (Signed)
Patient ID: Theresa Ford, female   DOB: 07-25-1927, 80 y.o.   MRN: [id number]   this is a routine visit.  Level of care skilled.  Facility [facility].  Chief complaint medical management of dementia hypertension ulcerative colitis GERD anemia Urinary retention......  .  History of present illness.  Patient is a pleasant elderly resident with a history of significant dementia.  Nonetheless her stay here has been quite unremarkable and uneventful.  Late last year she did develop some urinary retention--she has had a Foley catheter at times-and was treated for a UTI.--According to nursing staff she has been voiding fairly freely for some time now.  months ago it was thought she was having increased urinary frequency --r she was started on Macrobid but this was DC'd several days later when it was thought that she was then largely asymptomatic and family brought up concerns about previous episodes of diarrhea with prolonged antibiotics -  -  She does have severe dementia-Aricept and Namenda have been discontinued in the past because thought not to be effective  .  Does have a history of ulcerative colitis-she is on medication for this and this has not really been an issue.  Also listed history of hypertension but she is not on any medications and blood pressure appears to be well controlled -most recently 105/65-119/70-117/68  She did have some weight gain up till earlier this year then she has lost a bit of weight --this appears to have stabilized hovering around low-mid  160-- we will have to keep an eye on this the baseline appears to be low to mid 160s -- "  .  Family medical social history has been reviewed per history and physical on 08/24/2011  .  Medications reviewed per MAR.   Review of systems-stated in history of present illness again largely un attainable secondary to severe dementia --please see history of present illness-- apparently she has been at her baseline no complaints of chest pain  shortness of breath--her urinary issues have been stable .  Per her husband who is very attentive there are been no acute issues he says he noticed occasionally after eating she may have a slight cough but he says this is not persistent and does not really want aggressive workup of this for now-he says she appears to be tolerating her pureed-diet quite well  Physical exam   Temperature 98.1 pulse 60 respirations 20 blood pressure 105/65 weight is stable at 163.2 .  .  General this is a frail but fairly v elderly female in no distress--lying in bed  .  Her skin is warm and dry-she does appear to have numerous seb keratoses which is baseline--  -  Eyes pupils equal round reactive to light sclera and conjunctiva are clear.  Oropharynx somewhat limited exam since patient did not really open her mouth very wide but mucous membranes do appear moist-I do not see any evidence of a film on the tongue etc  .  Chest -- clear to auscultation without rhonchi rales or wheezes no labored breathing.  poor respiratory effort--is not really follow verbal commands   Heart is regular rate and rhythm without murmur gallop or rub .  Abdomen soft nontender positive bowel sounds .  Muscle skeletal-do not note any deformities--she has actually walks at times with assistance- .  Neurologic as she does have severe dementia-cranial nerves appear grossly intact-I do not see any lateralizing findings.  Psych-again she does have severe dementia-has difficulty following verbal commands.  Pleasant  smiling which appears to be her baseline  .  Labs 11/06/2013.  Sodium 142 potassium 4.1 BUN 15 creatinine 0.84.  Liver function tests within normal limits except albumin of 3.1.  WBC 5.1 hemoglobin 11.9 platelets 231   06/14/2013.  Sodium 136 potassium 3.8 BUN 18 creatinine 0.89  July 21st 2014.  WBC 5.2 hemoglobin 11.9 platelets 259.  Sodium 141 potassium 3.8 BUN 16 creatinine 1.02.  Liver function tests within  normal limits except albumin of 3.2.  10/04/2012.  CBC 8.0 hemoglobin 12.5 platelets 307.  Sodium 139 potassium 4 BUN 14 creatinine 0.90.  06/18/2012.  Liver function tests within normal limits   Assessment and plan.  -  #1-dementia-this continues to be at baseline-she is functioning well in this setting-has a very supportive family --  #2-hypertension-this appears stable currently on no medications.  #3 ulcerative colitis-continues to be stable on Sulfasalazine  #4 GERD-continues on Prilosec this does not appear to be an issue recently.  #5-allergic rhinitis -continues on Claritin.--This is stable and Claritin appears to help  #6-insomnia-she is on trazodone as needed.  #7-urinary retention-this has not been an issue recently has been quite stable  Of note will order a CBC and basic metabolic panel for updated values clinically she appears stable and at baseline  [id number].

## 2014-04-01 ENCOUNTER — Non-Acute Institutional Stay (SKILLED_NURSING_FACILITY): Payer: Medicare Other | Admitting: Internal Medicine

## 2014-04-01 DIAGNOSIS — H109 Unspecified conjunctivitis: Secondary | ICD-10-CM | POA: Insufficient documentation

## 2014-04-01 NOTE — Progress Notes (Signed)
Patient ID: Theresa Ford, female   DOB: 1927-03-10, 80 y.o.   MRN: [id number]   This is an acute visit.  Level of care skilled.  [facility]  Chief complaint-acute visit secondary to question conjunctivitis.  History of present illness.  Patient is a pleasant 80 year old female who has been a long-term resident of this facility-she has been very stable.  Family nursing staff recently noticed some watery discharge from her eyes bilaterally right greater than left-apparently there has been some yellowish exudate at times .  According to her husband who is very attentive this previously resolved with Systane eyedrops when they were giving regularly-appears she does have a when necessary order for this  I know she does have a history of allergies as well and continues on Claritin routinely.  Physical exam.  Right eye does appear to have a small amount of watery discharge I did not really note much erythema of the conjunctiva of either eye-pupils appear reactive bilaterally as well as visual acuity intact.  There is no surrounding erythema or edema of the eyelids or orbital area.  Assessment and plan.  Conjunctivitis unspecified-at this point we'll treat conservatively with Systane 0.6% eyedrops twice a day routinely for 7 days and then back to when necessary--although we may have to extend this.  Also monitor for any sign of exudate or increased erythema-any significant exudate would be more suspicious of a bacterial infection-however there is not really evidence  Of  that this evening-  [id number]

## 2014-04-25 ENCOUNTER — Non-Acute Institutional Stay (SKILLED_NURSING_FACILITY): Payer: Medicare Other | Admitting: Internal Medicine

## 2014-04-25 DIAGNOSIS — H109 Unspecified conjunctivitis: Secondary | ICD-10-CM

## 2014-04-25 NOTE — Progress Notes (Signed)
Patient ID: Theresa Ford, female   DOB: 1927/08/16, 80 y.o.   MRN: [id number]    This is an acute visit.  Level of care skilled.  Facility [facility].  Chief complaint-acute visit followup conjunctivitis.  History of present illness.  Patient is a elderly resident who has been a long-term resident of this facility.  Recently her husband states she's had some increased erythema irritation of her right eye-this was treated initially with Systane eyedrops.  However her husband states now she is occasionally having a cream-colored exudate from her right eye-she also has some increased erythema of the lateral aspect-.  Patient is a poor historian secondary to dementia but is not really complaining of any visual changes or eye pain.  Physical exam.  This is a frail elderly female in no distress sitting comfortably in her wheelchair.  Her skin is warm and dry.  I-lateral conjunctiva of the right eye has bright red erythema this appears to be a subconjunctival hemorrhage-I do not really note any exudate however her husband states this does occur occasionally and apparently the Systane eye drops are not helping.  Visual acuity appears grossly intact pupil is reactive.  System plan.  #1-conjunctivitis unspecified-the fact that she now has a cream-colored exudate would lead one to believe possibly there is a bacterial etiology-will treat empirically with Cipro ophthalmologic solution 0.3% 2 drops 3 times a day for 5 days and monitor.  The erythema today I suspect is a subconjunctival hemorrhage--at this point continue to monitor.  [id number]

## 2014-05-04 ENCOUNTER — Non-Acute Institutional Stay (SKILLED_NURSING_FACILITY): Payer: Medicare Other | Admitting: Internal Medicine

## 2014-05-04 DIAGNOSIS — H109 Unspecified conjunctivitis: Secondary | ICD-10-CM

## 2014-05-05 NOTE — Progress Notes (Signed)
Patient ID: Theresa Ford, female   DOB: Mar 11, 1927, 80 y.o.   MRN: [id number]   This is an acute visit.  Level of care skilled.  Facility [facility].  Chief complaint-acute visit followup conjunctivitis.  History of present illness.  Patient is a pleasant 80 year old female with a history of significant dementia-I recently saw her for an erythematous right eye-this appeared to be a combination of a subconjunctival hemorrhage as well as possibly conjunctivitis-her husband stated occasionally she had a cream-colored exudate from the eye-.  There was not much exudate when I saw last week however per her husband's history did empirically start her on a short course of an antibacterial eyedrops Cipro ophthalmologic solution to see if this would help.  Apparently it did-- she received 5 days but now it appears the exudate and erythema has returned according to her husband.  Patient has severe dementia and cannot really give any review of systems however apparently there've been no visual changes she does not appear to be had any pain here.  Physical exam.  In general this is a well-nourished elderly female in no distress sitting comfortably in her wheelchair.  Her skin is warm and dry.  Right eye-does have some pale erythema I do not see much exudate but apparently she has had this again at times according to her husband-the bright red erythema that lead me to suspect coexistent subconjunctival hemorrhage appears to be resolved however she just continued to have some pale erythema which would be more indicative of a conjunctivitis.  Her pupil is reactive I do not see any surrounding erythema of the orbital area or swelling of the eyelids-the left eye remains clear.  Assessment and plan.  Conjunctivitis I suspect bacterial Will restart the Cipro ophthalmologic solution 2 drops 3 times a day for 5 additional days and monitor I suspect she would benefit from a longer course here from what her husband  is telling me  [phone]

## 2014-07-04 ENCOUNTER — Non-Acute Institutional Stay (SKILLED_NURSING_FACILITY): Payer: Medicare Other | Admitting: Internal Medicine

## 2014-07-04 DIAGNOSIS — K5190 Ulcerative colitis, unspecified, without complications: Secondary | ICD-10-CM

## 2014-07-04 DIAGNOSIS — H109 Unspecified conjunctivitis: Secondary | ICD-10-CM

## 2014-07-04 DIAGNOSIS — G309 Alzheimer's disease, unspecified: Secondary | ICD-10-CM

## 2014-07-04 DIAGNOSIS — I10 Essential (primary) hypertension: Secondary | ICD-10-CM

## 2014-07-04 DIAGNOSIS — F0280 Dementia in other diseases classified elsewhere without behavioral disturbance: Secondary | ICD-10-CM

## 2014-07-04 NOTE — Progress Notes (Signed)
Patient ID: Theresa BarreBettie Ford, female   DOB: 11/07/1926, 80 y.o.   MRN: [id number]  this is a routine visit Level of care skilled.  [facility]  Chief complaint medical management of dementia hypertension ulcerative colitis GERD anemia Urinary retention......  .  History of present illness.  Patient is a pleasant elderly resident with a history of significant dementia.  Nonetheless her stay here has been quite unremarkable and uneventful.  Late last year she did develop some urinary retention--she has had a Foley catheter at times-and was treated for a UTI.--According to nursing staff she has been voiding fairly freely for some time now.  months ago it was thought she was having increased urinary frequency --r she was started on Macrobid but this was DC'd several days later when it was thought that she was then largely asymptomatic and family brought up concerns about previous episodes of diarrhea with prolonged antibiotics -  -  She does have severe dementia-Aricept and Namenda have been discontinued in the past because thought not to be effective  .  Does have a history of ulcerative colitis-she is on medication for this and this has not really been an issue.  Also listed history of hypertension but she is not on any medications and blood pressure appears to be well controlled -most recenty  124/69-112/73 She did have some weight gain up till earlier this year then she has lost a bit of weight --this appears to have stabilized hovering around low-mid 160-- we will have to keep an eye on this the baseline appears to be low to mid 160s --  She does have a history of some recurrent conjunctivitis this apparently has responded to antibiotic eyedrops it looks like she will need this again for her right eye "  .  Family medical social history has been reviewed per history and physical on 08/24/2011  .  Medications reviewed per MAR.  Review of systems-stated in history of  present illness again largely un attainable secondary to severe dementia --please see history of present illness-- apparently she has been at her baseline no complaints of chest pain shortness of breath--her urinary issues have been stable--appears to have developed some mild conjunctivitis of her right eye again .    Physical exam   Temperature 98.1 pulse 68 respirations 20 blood pressure 124/69 weight is relatively stable at 165.2 .  .  General this is a frail but fairly v elderly female in no distress--lying in bed  .  Her skin is warm and dry-she does appear to have numerous seb keratoses which is baseline--  -  Eyes pupils equal round reactive to light sclera and conjunctiva are clearon the left-right eye appears to have some mild erythema and a small amount of exudate at the lid margins.  Oropharynx somewhat limited exam since patient did not really open her mouth very wide but mucous membranes do appear moist-I do not see any evidence of a film on the tongue etc  .  Chest -- clear to auscultation without rhonchi rales or wheezes no labored breathing. poor respiratory effort-does not really follow verbal commands   Heart is regular rate and rhythm without murmur gallop or rub .  Abdomen soft nontender positive bowel sounds .  Muscle skeletal-do not note any deformities--she has actually walks at times with assistance- .  Neurologic as she does have severe dementia-cranial nerves appear grossly intact-I do not see any lateralizing findings.  Psych-again she does have severe dementia-has difficulty following verbal commands.  Pleasant smiling which appears to be her baseline  .  Labs  March 10 2014.  WBC 5.5 hemoglobin 11.7 platelets 242.  Sodium 141 potassium 4.1 BUN 15 creatinine 0.93 11/06/2013.  Sodium 142 potassium 4.1 BUN 15 creatinine 0.84.  Liver function tests within normal limits except albumin of 3.1.  WBC 5.1 hemoglobin 11.9 platelets  231  06/14/2013.  Sodium 136 potassium 3.8 BUN 18 creatinine 0.89  July 21st 2014.  WBC 5.2 hemoglobin 11.9 platelets 259.  Sodium 141 potassium 3.8 BUN 16 creatinine 1.02.  Liver function tests within normal limits except albumin of 3.2.  10/04/2012.  CBC 8.0 hemoglobin 12.5 platelets 307.  Sodium 139 potassium 4 BUN 14 creatinine 0.90.  06/18/2012.  Liver function tests within normal limits  Assessment and plan.  -  #1-dementia-this continues to be at baseline-she is functioning well in this setting-has a very supportive family --  #2-hypertension-this appears stable currently on no medications.  #3 ulcerative colitis-continues to be stable on Sulfasalazine  #4 GERD-continues on Prilosec this does not appear to be an issue recently.  #5-allergic rhinitis -continues on Claritin.--This is stable and Claritin appears to help  #6-insomnia-she is on trazodone as needed.  #7-urinary retention-this has not been an issue recently has been quite stable #8-conjunctivitis-will restart Cipro opth solution 2 drops 3 times a day to right eye 7 days and monitor  Of note will order a CBC and CMP  for updated values clinically she appears stable and at baseline   [id number].

## 2014-08-20 ENCOUNTER — Non-Acute Institutional Stay (SKILLED_NURSING_FACILITY): Payer: Medicare Other | Admitting: Internal Medicine

## 2014-08-20 DIAGNOSIS — I10 Essential (primary) hypertension: Secondary | ICD-10-CM

## 2014-08-20 DIAGNOSIS — F0280 Dementia in other diseases classified elsewhere without behavioral disturbance: Secondary | ICD-10-CM

## 2014-08-20 DIAGNOSIS — K219 Gastro-esophageal reflux disease without esophagitis: Secondary | ICD-10-CM

## 2014-08-20 DIAGNOSIS — G309 Alzheimer's disease, unspecified: Secondary | ICD-10-CM

## 2014-08-20 DIAGNOSIS — T148 Other injury of unspecified body region: Secondary | ICD-10-CM

## 2014-08-20 DIAGNOSIS — T148XXA Other injury of unspecified body region, initial encounter: Secondary | ICD-10-CM | POA: Insufficient documentation

## 2014-08-20 NOTE — Progress Notes (Signed)
Patient ID: Theresa Ford, female   DOB: [birth year], [age] y.o.   MRN: [id number]   this is a routine visit Level of care skilled.  [facility]   Chief complaint medical management of dementia hypertension ulcerative colitis GERD anemia Urinary retention......  .  History of present illness.  Patient is a pleasant elderly resident with a history of significant dementia.  Nonetheless her stay here has been quite unremarkable and uneventful  On the dorsal aspect of her left hand under her thumb she does appear to have developed a hematoma with a raised blood-filled area approximately 1-1/2 cm in diameter-this is not acutely tender there appears to be a small amount of bruising surrounding it-according to her husband-she did have a small bruise there previously In regards to other issues.....  Late last year she did develop some urinary retention--she has had a Foley catheter at times-and was treated for a UTI.--According to nursing staff she has been voiding fairly freely for some time now.  At one point it was thought she was having increased urinary frequency --r she was started on Macrobid but this was DC'd several days later when it was thought that she was then largely asymptomatic and family brought up concerns about previous episodes of diarrhea with prolonged antibiotics -  -  She does have severe dementia-Aricept and Namenda have been discontinued in the past because thought not to be effective  .  Does have a history of ulcerative colitis-she is on medication for this and this has not really been an issue.  Also listed history of hypertension but she is not on any medications and blood pressure appears to be well controlled -most recenty135/65 She did have some weight gain up till earlier this year then she has lost a bit of weight --this appears to have stabilized hovering around low-mid 160--  the baseline appears to be low to mid 160s --  She does have a history of  conjunctivitis somewhat recurrent but this has been stable recently    "  .  Family medical social history has been reviewed per history and physical on 08/24/2011  .  Medications reviewed per MAR.  Review of systems-stated in history of present illness again largely un attainable secondary to severe dementia --please see history of present illness-- apparently she has been at her baseline no complaints of chest pain shortness of breath--her urinary issues have been stable--again appears to have developed a hematoma on her left hand .    Physical exam   Temperature 98.6 pulse 80 respirations 18 blood pressure 135/65 weight is stable at 165.4 .  .  General this is a frail but fairly v elderly female in no distress--eating comfortably in her wheelchair .  Her skin is warm and dry-she does appear to have numerous seb keratoses which is baseline-- On the back of her left hand inferior to the thumb again there is what appears to be a blood-filled hematoma approximately a centimeter and a half in diameter with a small amount of violaceous bruising surrounding it this does not appear to be acutely tender warm  -  Eyes pupils equal round reactive to light sclera and conjunctiva are clear   Oropharynx somewhat limited exam since patient did not really open her mouth very wide but mucous membranes do appear moist-I do not see any evidence of a film on the tongue etc  .  Chest -- clear to auscultation without rhonchi rales or wheezes no labored breathing. poor respiratory effort-does  not really follow verbal commands   Heart is regular rate and rhythm without murmur gallop or rub .  Abdomen soft nontender positive bowel sounds .  Muscle skeletal-do not note any deformities--she has actually walks at times with assistance-I do not note any left hand discomfort with palpation or wrist pain with flexion extension of the wrist-there is no edema of the hand- capillary refill is  intact- radial pulse intact .  Neurologic as she does have severe dementia-cranial nerves appear grossly intact-I do not see any lateralizing findings.  Psych-again she does have severe dementia-has difficulty following verbal commands.  Pleasant smiling which appears to be her baseline  .  Labs  07/07/2014.  WBC 6.7 hemoglobin 12.7 platelets 257.  Sodium 141 potassium 4 BUN 15 creatinine 0.94-liver function tests within normal limits.    March 10 2014.  WBC 5.5 hemoglobin 11.7 platelets 242.  Sodium 141 potassium 4.1 BUN 15 creatinine 0.93 11/06/2013.  Sodium 142 potassium 4.1 BUN 15 creatinine 0.84.  Liver function tests within normal limits except albumin of 3.1.  WBC 5.1 hemoglobin 11.9 platelets 231  06/14/2013.  Sodium 136 potassium 3.8 BUN 18 creatinine 0.89  July 21st 2014.  WBC 5.2 hemoglobin 11.9 platelets 259.  Sodium 141 potassium 3.8 BUN 16 creatinine 1.02.  Liver function tests within normal limits except albumin of 3.2.  10/04/2012.  CBC 8.0 hemoglobin 12.5 platelets 307.  Sodium 139 potassium 4 BUN 14 creatinine 0.90.  06/18/2012.  Liver function tests within normal limits  Assessment and plan.  -  #1-dementia-this continues to be at baseline-she is functioning well in this setting-has a very supportive family --  #2-hypertension-this appears stable currently on no medications.  #3 ulcerative colitis-continues to be stable on Sulfasalazine  #4 GERD-continues on Prilosec this does not appear to be an issue recently.  #5-allergic rhinitis -continues on Claritin.--This is stable and Claritin appears to help  #6-insomnia-she is on trazodone as needed.  #7-urinary retention-this has not been an issue recently has been quite stable #8-conjunctivitis-poorly not in issue will have to be monitored however #9-left hand hematoma contusion-at this point will monitor for any changes increased size erythema or warmth or pain --I did  discuss this quite extensively with her husband this evening-he is very attentive and certainly discussed that if he sees any changes that concern him certainly to let staff know I did state that sometimes a small amount of traumacan lead to this presentation especially if there is already a small bruise in the area  Will obtain a CBC with platelets  in the morning.    [id number].

## 2014-09-01 ENCOUNTER — Non-Acute Institutional Stay (SKILLED_NURSING_FACILITY): Payer: Medicare Other | Admitting: Internal Medicine

## 2014-09-01 DIAGNOSIS — H109 Unspecified conjunctivitis: Secondary | ICD-10-CM

## 2014-09-01 DIAGNOSIS — Z22322 Carrier or suspected carrier of Methicillin resistant Staphylococcus aureus: Secondary | ICD-10-CM

## 2014-09-04 NOTE — Progress Notes (Addendum)
Patient ID: Keturah BarreBettie Sprick, female   DOB: 1927/07/22, 81 y.o.   MRN: [id number]               PROGRESS NOTE  DATE:  09/01/2013      FACILITY: [facility]    LEVEL OF CARE:   SNF   Acute Visit   CHIEF COMPLAINT:  Conjunctivitis.    HISTORY OF PRESENT ILLNESS:  Apparently, Mrs. Gerda DissLuking has been having a recurrent intense conjunctivitis of the right greater than left eye.   She was placed on ciprofloxacin ophthalmic on 08/29/2014.  Apparently, this has been a recurrent issue.  A culture of the eye drainage grew MRSA from 08/29/2014.  I am not completely sure why this was done.  Her husband tells me that apparently this will clear up with eyedrops.  However, 4-5 days later, again it becomes really intense to the point that her eye is matted closed at night, especially on the right.    PHYSICAL EXAMINATION:   HEENT:   EYES:  Actually this morning, her eyes look fine.  There is absolutely no drainage, erythema that I can see.  Her eyes look stable.    ASSESSMENT/PLAN:                          Conjunctivitis with MRSA.  I have prescribed gentamycin ophthalmic for five days to both eyes.  I am doing this because of the recurrence of issues.      CPT CODE: [id number]

## 2014-11-05 ENCOUNTER — Non-Acute Institutional Stay (SKILLED_NURSING_FACILITY): Payer: Medicare Other | Admitting: Internal Medicine

## 2014-11-05 DIAGNOSIS — G309 Alzheimer's disease, unspecified: Secondary | ICD-10-CM

## 2014-11-05 DIAGNOSIS — K219 Gastro-esophageal reflux disease without esophagitis: Secondary | ICD-10-CM

## 2014-11-05 DIAGNOSIS — F0280 Dementia in other diseases classified elsewhere without behavioral disturbance: Secondary | ICD-10-CM

## 2014-11-05 DIAGNOSIS — I10 Essential (primary) hypertension: Secondary | ICD-10-CM | POA: Diagnosis not present

## 2014-11-05 NOTE — Progress Notes (Signed)
Patient ID: Theresa Ford, female   DOB: [birth year], 81 y.o.   MRN: [id number]   this is a routine visit Level of care skilled.  [facility]   Chief complaint medical management of dementia hypertension ulcerative colitis GERD anemia Urinary retention......  .  History of present illness.  Patient is a pleasant elderly resident with a history of significant dementia.  Nonetheless her stay here has been quite unremarkable and uneventful   .Marland Kitchen....  Late last year she did develop some urinary retention--she has had a Foley catheter at times-and was treated for a UTI.--According to nursing staff she has been voiding fairly freely for some time now.  At one point it was thought she was having increased urinary frequency --r she was started on Macrobid but this was DC'd several days later when it was thought that she was then largely asymptomatic and family brought up concerns about previous episodes of diarrhea with prolonged antibiotics -  -  She does have severe dementia-Aricept and Namenda have been discontinued in the past because thought not to be effective  .  Does have a history of ulcerative colitis-she is on medication for this and this has not really been an issue.  Also listed history of hypertension but she is not on any medications and blood pressure appears to be well controlled -most recenty118/51 She did have some weight gain up till earlier this year then she has lost a bit of weight --this appears to have stabilized hovering around low-mid 160--  the baseline appears to be low to mid 160s --  She does have a history of conjunctivitis somewhat recurrent but this has been stable recently--this usually responds to a short course of topical antibacterials    "  .  Family medical social history has been reviewed per history and physical on 08/24/2011  .  Medications reviewed per MAR.  Review of systems-stated in history of present illness again largely un  attainable secondary to severe dementia --please see history of present illness-- apparently she has been at her baseline no complaints of chest pain shortness of breath--her urinary issues have been stable--.    Physical exam   Temperature 98.3 pulse 73 respirations 20 blood pressure 118/51 weight is stable at 163.6 .  .  General this is a frail but fairly v elderly female in no distress--eating comfortably in her wheelchair .  Her skin is warm and dry-she does appear to have numerous seb keratoses which is baseline--   -  Eyes pupils equal round reactive to light sclera and conjunctiva are clear   Oropharynx somewhat limited exam since patient did not really open her mouth very wide but mucous membranes do appear moist-I do not see any evidence of a film on the tongue etc  .  Chest -- clear to auscultation without rhonchi rales or wheezes no labored breathing. poor respiratory effort-does not really follow verbal commands   Heart is regular rate and rhythm without murmur gallop or rub .  Abdomen soft nontender positive bowel sounds .  Muscle skeletal-do not note any deformities--she has actually walks at times with assistance- Moves all her extremities at baseline .  Neurologic as she does have severe dementia-cranial nerves appear grossly intact-I do not see any lateralizing findings.  Psych-again she does have severe dementia-has difficulty following verbal commands.  Pleasant smiling which appears to be her baseline  .  Labs  07/07/2014.  WBC 6.7 hemoglobin 12.7 platelets 257.  Sodium 141 potassium 4 BUN  15 creatinine 0.94-liver function tests within normal limits.    March 10 2014.  WBC 5.5 hemoglobin 11.7 platelets 242.  Sodium 141 potassium 4.1 BUN 15 creatinine 0.93 11/06/2013.  Sodium 142 potassium 4.1 BUN 15 creatinine 0.84.  Liver function tests within normal limits except albumin of 3.1.  WBC 5.1 hemoglobin 11.9 platelets  231  06/14/2013.  Sodium 136 potassium 3.8 BUN 18 creatinine 0.89  July 21st 2014.  WBC 5.2 hemoglobin 11.9 platelets 259.  Sodium 141 potassium 3.8 BUN 16 creatinine 1.02.  Liver function tests within normal limits except albumin of 3.2.  10/04/2012.  CBC 8.0 hemoglobin 12.5 platelets 307.  Sodium 139 potassium 4 BUN 14 creatinine 0.90.  06/18/2012.  Liver function tests within normal limits  Assessment and plan.  -  #1-dementia-this continues to be at baseline-she is functioning well in this setting-has a very supportive family --  #2-hypertension-this appears stable currently on no medications.  #3 ulcerative colitis-continues to be stable on Sulfasalazine  #4 GERD-continues on Prilosec this does not appear to be an issue recently.  #5-allergic rhinitis -continues on Claritin.--This is stable and Claritin appears to help  #6-insomnia-she is on trazodone as needed.  #7-urinary retention-this has not been an issue recently has been quite stable #8-conjunctivitis-currently  not in issue will have to be monitored however  Will update a CBC and metabolic panel for updated values   [id number].

## 2014-11-06 ENCOUNTER — Other Ambulatory Visit (HOSPITAL_COMMUNITY)
Admission: AD | Admit: 2014-11-06 | Discharge: 2014-11-06 | Disposition: A | Discharge status: Home / Self Care | Payer: Medicare Other | Source: Skilled Nursing Facility | Attending: Internal Medicine | Admitting: Internal Medicine

## 2014-11-06 LAB — CBC WITH DIFFERENTIAL/PLATELET
BASOS ABS: 0 10*3/uL (ref 0.0–0.1)
BASOS PCT: 1 % (ref 0–1)
EOS PCT: 8 % — AB (ref 0–5)
Eosinophils Absolute: 0.4 10*3/uL (ref 0.0–0.7)
HCT: 33.7 % — ABNORMAL LOW (ref 36.0–46.0)
Hemoglobin: 10.9 g/dL — ABNORMAL LOW (ref 12.0–15.0)
Lymphocytes Relative: 39 % (ref 12–46)
Lymphs Abs: 2 10*3/uL (ref 0.7–4.0)
MCH: 31.5 pg (ref 26.0–34.0)
MCHC: 32.3 g/dL (ref 30.0–36.0)
MCV: 97.4 fL (ref 78.0–100.0)
Monocytes Absolute: 0.5 10*3/uL (ref 0.1–1.0)
Monocytes Relative: 10 % (ref 3–12)
Neutro Abs: 2.2 10*3/uL (ref 1.7–7.7)
Neutrophils Relative %: 42 % — ABNORMAL LOW (ref 43–77)
PLATELETS: 242 10*3/uL (ref 150–400)
RBC: 3.46 MIL/uL — ABNORMAL LOW (ref 3.87–5.11)
RDW: 13.3 % (ref 11.5–15.5)
WBC: 5.1 10*3/uL (ref 4.0–10.5)

## 2014-11-06 LAB — COMPREHENSIVE METABOLIC PANEL
ALT: 12 U/L (ref 0–35)
AST: 17 U/L (ref 0–37)
Albumin: 3.4 g/dL — ABNORMAL LOW (ref 3.5–5.2)
Alkaline Phosphatase: 78 U/L (ref 39–117)
Anion gap: 6 (ref 5–15)
BUN: 17 mg/dL (ref 6–23)
CALCIUM: 9 mg/dL (ref 8.4–10.5)
CO2: 24 mmol/L (ref 19–32)
Chloride: 109 mmol/L (ref 96–112)
Creatinine, Ser: 0.96 mg/dL (ref 0.50–1.10)
GFR calc Af Amer: 61 mL/min — ABNORMAL LOW (ref >90)
GFR, EST NON AFRICAN AMERICAN: 52 mL/min — AB (ref >90)
GLUCOSE: 96 mg/dL (ref 70–99)
Potassium: 4 mmol/L (ref 3.5–5.1)
Sodium: 139 mmol/L (ref 135–145)
Total Bilirubin: 0.6 mg/dL (ref 0.3–1.2)
Total Protein: 6.4 g/dL (ref 6.0–8.3)

## 2014-11-21 ENCOUNTER — Other Ambulatory Visit (HOSPITAL_COMMUNITY)
Admission: AD | Admit: 2014-11-21 | Discharge: 2014-11-21 | Disposition: A | Discharge status: Home / Self Care | Payer: Medicare Other | Source: Skilled Nursing Facility | Attending: Internal Medicine | Admitting: Internal Medicine

## 2014-11-21 DIAGNOSIS — R69 Illness, unspecified: Secondary | ICD-10-CM | POA: Insufficient documentation

## 2014-11-21 DIAGNOSIS — I10 Essential (primary) hypertension: Secondary | ICD-10-CM | POA: Diagnosis not present

## 2014-11-21 LAB — CBC
HEMATOCRIT: 38.7 % (ref 36.0–46.0)
Hemoglobin: 12.7 g/dL (ref 12.0–15.0)
MCH: 31.8 pg (ref 26.0–34.0)
MCHC: 32.8 g/dL (ref 30.0–36.0)
MCV: 96.8 fL (ref 78.0–100.0)
Platelets: 274 10*3/uL (ref 150–400)
RBC: 4 MIL/uL (ref 3.87–5.11)
RDW: 13.2 % (ref 11.5–15.5)
WBC: 5.9 10*3/uL (ref 4.0–10.5)

## 2014-12-15 ENCOUNTER — Encounter (HOSPITAL_COMMUNITY)
Admission: RE | Admit: 2014-12-15 | Discharge: 2014-12-15 | Disposition: A | Discharge status: Home / Self Care | Payer: Medicare Other | Source: Skilled Nursing Facility | Attending: Internal Medicine | Admitting: Internal Medicine

## 2014-12-15 DIAGNOSIS — K529 Noninfective gastroenteritis and colitis, unspecified: Secondary | ICD-10-CM | POA: Insufficient documentation

## 2014-12-15 DIAGNOSIS — I10 Essential (primary) hypertension: Secondary | ICD-10-CM | POA: Diagnosis not present

## 2014-12-15 LAB — CBC WITH DIFFERENTIAL/PLATELET
BASOS ABS: 0 10*3/uL (ref 0.0–0.1)
Basophils Relative: 1 % (ref 0–1)
Eosinophils Absolute: 0.7 10*3/uL (ref 0.0–0.7)
Eosinophils Relative: 8 % — ABNORMAL HIGH (ref 0–5)
HCT: 38.9 % (ref 36.0–46.0)
Hemoglobin: 12.5 g/dL (ref 12.0–15.0)
LYMPHS ABS: 2.2 10*3/uL (ref 0.7–4.0)
LYMPHS PCT: 25 % (ref 12–46)
MCH: 31.4 pg (ref 26.0–34.0)
MCHC: 32.1 g/dL (ref 30.0–36.0)
MCV: 97.7 fL (ref 78.0–100.0)
Monocytes Absolute: 0.7 10*3/uL (ref 0.1–1.0)
Monocytes Relative: 8 % (ref 3–12)
NEUTROS PCT: 58 % (ref 43–77)
Neutro Abs: 5.1 10*3/uL (ref 1.7–7.7)
PLATELETS: 225 10*3/uL (ref 150–400)
RBC: 3.98 MIL/uL (ref 3.87–5.11)
RDW: 13.6 % (ref 11.5–15.5)
WBC: 8.6 10*3/uL (ref 4.0–10.5)

## 2014-12-15 LAB — BASIC METABOLIC PANEL
Anion gap: 9 (ref 5–15)
BUN: 19 mg/dL (ref 6–23)
CHLORIDE: 110 mmol/L (ref 96–112)
CO2: 23 mmol/L (ref 19–32)
CREATININE: 0.95 mg/dL (ref 0.50–1.10)
Calcium: 9.6 mg/dL (ref 8.4–10.5)
GFR calc Af Amer: 62 mL/min — ABNORMAL LOW (ref >90)
GFR calc non Af Amer: 53 mL/min — ABNORMAL LOW (ref >90)
GLUCOSE: 109 mg/dL — AB (ref 70–99)
POTASSIUM: 4 mmol/L (ref 3.5–5.1)
Sodium: 142 mmol/L (ref 135–145)

## 2014-12-16 ENCOUNTER — Non-Acute Institutional Stay (SKILLED_NURSING_FACILITY): Payer: Medicare Other | Admitting: Internal Medicine

## 2014-12-16 DIAGNOSIS — I10 Essential (primary) hypertension: Secondary | ICD-10-CM

## 2014-12-16 DIAGNOSIS — F0280 Dementia in other diseases classified elsewhere without behavioral disturbance: Secondary | ICD-10-CM | POA: Diagnosis not present

## 2014-12-16 DIAGNOSIS — G309 Alzheimer's disease, unspecified: Secondary | ICD-10-CM | POA: Diagnosis not present

## 2014-12-16 DIAGNOSIS — R05 Cough: Secondary | ICD-10-CM

## 2014-12-16 DIAGNOSIS — K219 Gastro-esophageal reflux disease without esophagitis: Secondary | ICD-10-CM | POA: Diagnosis not present

## 2014-12-16 DIAGNOSIS — R059 Cough, unspecified: Secondary | ICD-10-CM | POA: Insufficient documentation

## 2014-12-16 NOTE — Progress Notes (Signed)
Patient ID: Theresa Ford, female   DOB: 11-23-1926, 81 y.o.   MRN: [id number]      this is a routine--acute visit Level of care skilled.  Facility [facility].   Chief complaint medical management of dementia hypertension ulcerative colitis GERD anemia Urinary retention--acute visit follow-up cough .congestion.....  .  History of present illness.  Patient is a pleasant elderly resident with a history of significant dementia.  Nonetheless her stay here has been quite unremarkable and uneventful--she did develop a cough and apparently some upper respiratory congestion over the weekend she does have a history of allergic rhinitis she is on Claritin-she is now receiving guaifenesin when necessary-this appears to be helping   She also received Atrovent nebs routinely for 48 hours now when necessary. I spoke with her husband at bedside tonight he stated the cough is significantly better today compared to yesterday-nursing staff reports there is still some residual cough     .Marland Kitchen...  Late last year she did develop some urinary retention--she has had a Foley catheter at times-and was treated for a UTI.--According to nursing staff she has been voiding fairly freely for some time now.  At one point it was thought she was having increased urinary frequency --r she was started on Macrobid but this was DC'd several days later when it was thought that she was then largely asymptomatic and family brought up concerns about previous episodes of diarrhea with prolonged antibiotics -  -  She does have severe dementia-Aricept and Namenda have been discontinued in the past because thought not to be effective  .  Does have a history of ulcerative colitis-she is on medication for this and this has not really been an issue.  Also listed history of hypertension but she is not on any medications and blood pressure has been well controlled in the past-however reading earlier today by machine showed an elevated  diastolic of 108-however I rechecked this manually and got 138/80---  She did have some weight gain up till earlier this year then she has lost a bit of weight --this appears to have stabilized hovering around low-mid 160--  the baseline appears to be low to mid 160s --  She does have a history of conjunctivitis somewhat recurrent but this has been stable recently speaking with her husband tonight he stated that her eyes did appear somewhat red yesterday but this appears to have cleared up today per my exam--conjunctivitis usually responds to a short course of topical antibacterials-when it occurs-    "  .  Family medical social history has been reviewed per history and physical on 08/24/2011  .  Medications reviewed per MAR.  Review of systems-stated in history of present illness again largely un attainable secondary to severe dementia --please see history of present illness-- apparently she has been at her baseline no complaints of chest pain shortness of breath--her urinary issues have been stable- Her cough appears to be improved-.    Physical exam   She is afebrile pulse of 76 respirations 20 blood pressure taken manually was 138/80--weight is stable in the mid 160s .  Marland Kitchen  General this is a frail but fairly  elderly female in no distress--sitting comfortably in her wheelchair .  Her skin is warm and dry-she does appear to have numerous seb keratoses which is baseline--   -  Eyes pupils equal round reactive to light sclera and conjunctiva are clear   Oropharynx somewhat limited exam since patient did not really open her mouth  very wide but mucous membranes do appear moist-I do not see any evidence of a film on the tongue etc  .  Chest -- clear to auscultation without rhonchi rales or wheezes no labored breathing. poor respiratory effort-does not really follow verbal commands   Heart is regular rate and rhythm without murmur gallop or rub .  Abdomen soft  nontender positive bowel sounds .  Muscle skeletal-do not note any deformities--she has actually walks at times with assistance- Moves all her extremities at baseline .  Neurologic as she does have severe dementia-cranial nerves appear grossly intact-I do not see any lateralizing findings.  Psych-again she does have severe dementia-has difficulty following verbal commands.  Pleasant smiling which appears to be her baseline  .  Labs  12/15/2014.  WBC 8.6 hemoglobin 12.5 platelets 225.  Sodium 142 potassium 4 BUN 19 creatinine 0.95 CO2 23  07/07/2014.  WBC 6.7 hemoglobin 12.7 platelets 257.  Sodium 141 potassium 4 BUN 15 creatinine 0.94-liver function tests within normal limits.    March 10 2014.  WBC 5.5 hemoglobin 11.7 platelets 242.  Sodium 141 potassium 4.1 BUN 15 creatinine 0.93 11/06/2013.  Sodium 142 potassium 4.1 BUN 15 creatinine 0.84.  Liver function tests within normal limits except albumin of 3.1.  WBC 5.1 hemoglobin 11.9 platelets 231  06/14/2013.  Sodium 136 potassium 3.8 BUN 18 creatinine 0.89  July 21st 2014.  WBC 5.2 hemoglobin 11.9 platelets 259.  Sodium 141 potassium 3.8 BUN 16 creatinine 1.02.  Liver function tests within normal limits except albumin of 3.2.  10/04/2012.  CBC 8.0 hemoglobin 12.5 platelets 307.  Sodium 139 potassium 4 BUN 14 creatinine 0.90.  06/18/2012.  Liver function tests within normal limits  Assessment and plan.  -  #1-dementia-this continues to be at baseline-she is functioning well in this setting-has a very supportive family --  #2-hypertension-machine reading today showed an elevated diastolic of 108 however on recheck I got 138/80-her blood pressure had been under good control for some time now-will check blood pressures daily for now to keep an eye on this. --We will have this checked manually #3 ulcerative colitis-continues to be stable on Sulfasalazine  #4 GERD-continues on Prilosec this  does not appear to be an issue recently.  #5-allergic rhinitis -continues on Claritin.--This is stable and Claritin appears to help- recent cough appears improved she is on guaifenesin when necessary also Atrovent nebs when necessary Speaking with her husband at bedside apparently her cough was quite significant yesterday and thought she might be getting short of breath however she is resting in her wheelchair quite comfortably at baseline today he feels the cough has improved significantly.  I did discuss future treatment options including obtaining a chest x-ray ifthe cough worsens-also will continue nebulizers when necessary for now This appears to be improving nonetheless   #6-insomnia-she is on trazodone as needed.  #7-urinary retention-this has not been an issue recently has been quite stable #8-conjunctivitis-currently  not in issue will have to be monitored however     [id number]--of note greater than 35 minutes spent assessing patient-rechecking her blood pressure-discussing her status with nursing staff as well as with her husband at bedside-and coordinating and formulating a plan of care-of note greater than 50% of time spent coordinating plan of care with discussion with nursing staff as well as with her husband who was at bedside.

## 2014-12-20 ENCOUNTER — Non-Acute Institutional Stay (SKILLED_NURSING_FACILITY): Payer: Medicare Other | Admitting: Internal Medicine

## 2014-12-20 DIAGNOSIS — R059 Cough, unspecified: Secondary | ICD-10-CM

## 2014-12-20 DIAGNOSIS — R05 Cough: Secondary | ICD-10-CM | POA: Diagnosis not present

## 2014-12-20 DIAGNOSIS — J209 Acute bronchitis, unspecified: Secondary | ICD-10-CM

## 2014-12-25 DIAGNOSIS — J209 Acute bronchitis, unspecified: Secondary | ICD-10-CM | POA: Insufficient documentation

## 2014-12-25 NOTE — Progress Notes (Signed)
Patient ID: Theresa Ford, female   DOB: [birth year], 81 y.o.   MRN: [id number]        this is an--acute visit Level of care skilled.  Facility [facility].   Chief complaint--- acute visit follow-up cough .....  .  History of present illness.  Patient is a pleasant elderly resident with a history of significant dementia.  Nonetheless her stay here has been quite unremarkable and uneventful--she did develop a cough and apparently some upper respiratory congestion last weekend she does have a history of allergic rhinitis she is on Claritin-she also received guaifenesin when necessary-this along with Atrovent nebulizers  Initially routinely and then when necessary-apparently this helped.  However her husband states tonight that cough seems to be increasingagain-she is on when necessary nebulizers there is some thought she would benefit more from routine nebulizers again- Per her husband she seems to benefit from this.       "  .  Family medical social history has been reviewed per history and physical on 08/24/2011  .  Medications reviewed per MAR.  Review of systems-stated in history of present illness again largely un attainable secondary to severe dementia --please see history of present illness-- she does not complain of shortness of breath although again she is a very poor historian secondary to dementia I can't she does have somewhat of a nonproductive cough which appears to be persistent.    Physical exam   She is afebrile pulse of 70 respirations 18 blood pressure pending .  Marland Kitchen  General this is an elderly female in no distress--sitting comfortably in her wheelchair .  Her skin is warm and dry-she does appear to have numerous seb keratoses which is baseline--   -  Eyes pupils equal round reactive to light sclera and conjunctiva are clear   Oropharynx somewhat limited exam since patient did not really open her mouth very wide but mucous membranes do  appear moist-I do not see any evidence of a film on the tongue etc  .  Chest -- does appear to have some slight upper respiratory congestion bilaterally there is poor effort but I do not note any labored breathing -does not really follow verbal commands   Heart is regular rate and rhythm without murmur gallop or rub .  Marland Kitchen  Psych-again she does have severe dementia-has difficulty following verbal commands.  Pleasant smiling which appears to be her baseline  .  Labs  12/15/2014.  WBC 8.6 hemoglobin 12.5 platelets 225.  Sodium 142 potassium 4 BUN 19 creatinine 0.95 CO2 23  07/07/2014.  WBC 6.7 hemoglobin 12.7 platelets 257.  Sodium 141 potassium 4 BUN 15 creatinine 0.94-liver function tests within normal limits.    March 10 2014.  WBC 5.5 hemoglobin 11.7 platelets 242.  Sodium 141 potassium 4.1 BUN 15 creatinine 0.93 11/06/2013.  Sodium 142 potassium 4.1 BUN 15 creatinine 0.84.  Liver function tests within normal limits except albumin of 3.1.  WBC 5.1 hemoglobin 11.9 platelets 231  06/14/2013.  Sodium 136 potassium 3.8 BUN 18 creatinine 0.89  July 21st 2014.  WBC 5.2 hemoglobin 11.9 platelets 259.  Sodium 141 potassium 3.8 BUN 16 creatinine 1.02.  Liver function tests within normal limits except albumin of 3.2.  10/04/2012.  CBC 8.0 hemoglobin 12.5 platelets 307.  Sodium 139 potassium 4 BUN 14 creatinine 0.90.  06/18/2012.  Liver function tests within normal limits  Assessment and plan.  -   Cough-with what appears to be some chest congestion-one would be suspicious  possibly of  bronchitis-I did discuss this with her husband at bedside-will start Avelox empirically 400 mg a day for 5 days-also Mucinex as an expectorant 600 mg twice a day for 7 days-according to her husband she benefits from routine nebulizers will change the Atrovent to routine every 6 hours while she is awake and do this for 72 hours- she continues on Claritin for some  history of allergic rhinitis which is chronic.  At this point will monitor-per discussion with her husband will defer an x-ray of the chest for now  [phone]

## 2015-01-30 ENCOUNTER — Non-Acute Institutional Stay (SKILLED_NURSING_FACILITY): Payer: Medicare Other | Admitting: Internal Medicine

## 2015-01-30 DIAGNOSIS — G309 Alzheimer's disease, unspecified: Secondary | ICD-10-CM

## 2015-01-30 DIAGNOSIS — F0280 Dementia in other diseases classified elsewhere without behavioral disturbance: Secondary | ICD-10-CM | POA: Diagnosis not present

## 2015-01-30 DIAGNOSIS — H109 Unspecified conjunctivitis: Secondary | ICD-10-CM

## 2015-01-30 DIAGNOSIS — K51919 Ulcerative colitis, unspecified with unspecified complications: Secondary | ICD-10-CM | POA: Diagnosis not present

## 2015-01-30 NOTE — Progress Notes (Signed)
Patient ID: Theresa Ford, female   DOB: 03/10/1927, 81 y.o.   MRN: [id number]      this is a routine visit Level of care skilled.  Facility [facility].   Chief complaint medical management of dementia hypertension ulcerative colitis GERD anemia Urinary retention-acute visit secondary to eye exudate suspected recurrent conjunctivitis....  .  History of present illness.  Patient is a pleasant elderly resident with a history of significant dementia.  Nonetheless her stay here has been quite unremarkable and uneventful--she did develop a cough and apparently some upper respiratory congestion several weeks ago she did responded to a course of Mucinex nebulizers as well as Avelox-I suspect she had some bronchitis      .Marland Kitchen...  Late last year she did develop some urinary retention--she has had a Foley catheter at times-and was treated for a UTI.--According to nursing staff she has been voiding fairly freely for an extended period now---   -  -  She does have severe dementia-Aricept and Namenda have been discontinued in the past because thought not to be effective  .  Does have a history of ulcerative colitis-she is on medication for this and this has not really been an issue.  Also listed history of hypertension but she is not on any medications and blood pressure has been well controlled in the past- Most recent blood pressure 136/63 144/70-124/75-I do not see consistent elevations--   She did have some weight gain up till earlier this year then she has lost a bit of weight --this appears to have stabilized hovering around low-mid 160--  the baseline appears to be low to mid 160s --  She does have a history of conjunctivitis somewhat recurrent--apparently she has developed some conjunctivitis of  her eyes bilaterally last day or 2-she usually responds well to a course of gentamicin and we will restart this- consider ophthalmology consult I discussed this with her husband who is  considering it --  She does not appear to have any visual changes or discomfort here   -    "  .  Family medical social history has been reviewed per history and physical on 08/24/2011  .  Medications reviewed per MAR.  Review of systems-stated in history of present illness again largely un attainable secondary to severe dementia --please see history of present illness-- apparently she has been at her baseline no complaints of chest pain shortness of breath--her urinary issues have been stable- Her cough appears to be improved I issues as noted above-.    Physical exam   Temperature 97.6 pulse 70 respirations 18 blood pressure 136/63--weight is stable in the mid 160s .  Marland Kitchen  General this is a frail but fairly  elderly female in no distress--sitting comfortably in her wheelchair .  Her skin is warm and dry-she does appear to have numerous seb keratoses which is baseline--   -  Eyes pupils equal round reactive to light sclera and conjunctiva are are slightly erythematous she does have somewhat of a cream-colored exudate bilaterally on her eyelids visual acuity appears grossly intact    Oropharynx somewhat limited exam since patient did not really open her mouth very wide but mucous membranes do appear moist-I do not see any evidence of a film on the tongue etc  .  Chest -- clear to auscultation without rhonchi rales or wheezes no labored breathing. poor respiratory effort-does not really follow verbal commands   Heart is regular rate and rhythm without murmur gallop or rub .  Abdomen soft nontender positive bowel sounds .  Muscle skeletal-do not note any deformities--she has actually walks at times with assistance- Moves all her extremities at baseline .  Neurologic as she does have severe dementia-cranial nerves appear grossly intact-I do not see any lateralizing findings.  Psych-again she does have severe dementia-has difficulty following verbal  commands.  Pleasant smiling which appears to be her baseline  .  Labs  12/15/2014.  WBC 8.6 hemoglobin 12.5 platelets 225.  Sodium 142 potassium 4 BUN 19 creatinine 0.95 CO2 23  07/07/2014.  WBC 6.7 hemoglobin 12.7 platelets 257.  Sodium 141 potassium 4 BUN 15 creatinine 0.94-liver function tests within normal limits.    March 10 2014.  WBC 5.5 hemoglobin 11.7 platelets 242.  Sodium 141 potassium 4.1 BUN 15 creatinine 0.93 11/06/2013.  Sodium 142 potassium 4.1 BUN 15 creatinine 0.84.  Liver function tests within normal limits except albumin of 3.1.  WBC 5.1 hemoglobin 11.9 platelets 231  06/14/2013.  Sodium 136 potassium 3.8 BUN 18 creatinine 0.89  July 21st 2014.  WBC 5.2 hemoglobin 11.9 platelets 259.  Sodium 141 potassium 3.8 BUN 16 creatinine 1.02.  Liver function tests within normal limits except albumin of 3.2.  10/04/2012.  CBC 8.0 hemoglobin 12.5 platelets 307.  Sodium 139 potassium 4 BUN 14 creatinine 0.90.  06/18/2012.  Liver function tests within normal limits  Assessment and plan.  -  #1-dementia-this continues to be at baseline-she is functioning well in this setting-has a very supportive family --  #2-hypertension- This appears to be stable on no medications  #3 ulcerative colitis-continues to be stable on Sulfasalazine  #4 GERD-continues on Prilosec this does not appear to be an issue recently.  #5-allergic rhinitis -continues on Claritin.--This is stable and Claritin appears to help- r    #6-insomnia-she is on trazodone as needed.  #7-urinary retention-this has not been an issue recently has been quite stable #8-conjunctivitis--as noted above we'll start gentamicin eyedrops again 0.3% 4 times a day to both eyes-monitor for resolution-we have discussed ophthalmology consult and her husband is considering this  [id number]      .

## 2015-02-02 ENCOUNTER — Encounter (HOSPITAL_COMMUNITY)
Admission: RE | Admit: 2015-02-02 | Discharge: 2015-02-02 | Disposition: A | Discharge status: Home / Self Care | Payer: Medicare Other | Source: Skilled Nursing Facility | Attending: Internal Medicine | Admitting: Internal Medicine

## 2015-02-02 DIAGNOSIS — I4891 Unspecified atrial fibrillation: Secondary | ICD-10-CM | POA: Insufficient documentation

## 2015-02-02 DIAGNOSIS — D520 Dietary folate deficiency anemia: Secondary | ICD-10-CM | POA: Diagnosis not present

## 2015-02-02 LAB — CBC WITH DIFFERENTIAL/PLATELET
BASOS ABS: 0 10*3/uL (ref 0.0–0.1)
Basophils Relative: 1 % (ref 0–1)
EOS ABS: 0.4 10*3/uL (ref 0.0–0.7)
Eosinophils Relative: 6 % — ABNORMAL HIGH (ref 0–5)
HCT: 36.4 % (ref 36.0–46.0)
Hemoglobin: 11.6 g/dL — ABNORMAL LOW (ref 12.0–15.0)
LYMPHS ABS: 2.6 10*3/uL (ref 0.7–4.0)
Lymphocytes Relative: 41 % (ref 12–46)
MCH: 30.4 pg (ref 26.0–34.0)
MCHC: 31.9 g/dL (ref 30.0–36.0)
MCV: 95.5 fL (ref 78.0–100.0)
MONO ABS: 0.6 10*3/uL (ref 0.1–1.0)
MONOS PCT: 10 % (ref 3–12)
NEUTROS ABS: 2.6 10*3/uL (ref 1.7–7.7)
Neutrophils Relative %: 42 % — ABNORMAL LOW (ref 43–77)
Platelets: 248 10*3/uL (ref 150–400)
RBC: 3.81 MIL/uL — AB (ref 3.87–5.11)
RDW: 13.7 % (ref 11.5–15.5)
WBC: 6.3 10*3/uL (ref 4.0–10.5)

## 2015-02-02 LAB — BASIC METABOLIC PANEL
ANION GAP: 7 (ref 5–15)
BUN: 16 mg/dL (ref 6–20)
CHLORIDE: 109 mmol/L (ref 101–111)
CO2: 24 mmol/L (ref 22–32)
CREATININE: 0.95 mg/dL (ref 0.44–1.00)
Calcium: 9.2 mg/dL (ref 8.9–10.3)
GFR calc Af Amer: >60 mL/min (ref >60)
GFR calc non Af Amer: 53 mL/min — ABNORMAL LOW (ref >60)
Glucose, Bld: 96 mg/dL (ref 65–99)
Potassium: 3.9 mmol/L (ref 3.5–5.1)
SODIUM: 140 mmol/L (ref 135–145)

## 2015-05-27 ENCOUNTER — Non-Acute Institutional Stay (SKILLED_NURSING_FACILITY): Payer: Medicare Other | Admitting: Internal Medicine

## 2015-05-27 DIAGNOSIS — K219 Gastro-esophageal reflux disease without esophagitis: Secondary | ICD-10-CM

## 2015-05-27 DIAGNOSIS — G309 Alzheimer's disease, unspecified: Secondary | ICD-10-CM

## 2015-05-27 DIAGNOSIS — H109 Unspecified conjunctivitis: Secondary | ICD-10-CM | POA: Diagnosis not present

## 2015-05-27 DIAGNOSIS — F0280 Dementia in other diseases classified elsewhere without behavioral disturbance: Secondary | ICD-10-CM

## 2015-05-27 DIAGNOSIS — I10 Essential (primary) hypertension: Secondary | ICD-10-CM | POA: Diagnosis not present

## 2015-05-27 NOTE — Progress Notes (Signed)
Patient ID: Theresa Ford, female   DOB: 19-Dec-1926, 81 y.o.   MRN: [id number]       this is a routine visit Level of care skilled.  Facility [facility].   Chief complaint medical management of dementia hypertension ulcerative colitis GERD anemia Urinary retention-acute visit secondary to eye exudate suspected recurrent conjunctivitis....  .  History of present illness.  Patient is a pleasant elderly resident with a history of significant dementia.  Nonetheless her stay here has been quite unremarkable and uneventful--she did develop a cough and apparently some upper respiratory congestion several weeks ago she dresponded to a course of Mucinex nebulizers as well as Avelox-I suspect she had some bronchitis      .Marland Kitchen...  Late last year she did develop some urinary retention--she has had a Foley catheter at times-and was treated for a UTI.--According to nursing staff she has been voiding fairly freely for an extended period now---   -  -  She does have severe dementia-Aricept and Namenda have been discontinued in the past because thought not to be effective  .  Does have a history of ulcerative colitis-she is on medication for this and this has not really been an issue.  Also listed history of hypertension but she is not on any medications and blood pressure has been well controlled in the past- Most recent blood pressures 128/71-130/65-   She did have some weight gain up till earlier this year then she has lost a bit of weight --this appears to have stabilized hovering around l-mid 160's-- -  She does have a history of conjunctivitis somewhat recurrent--apparently she has developed some conjunctivitis of  her eyes bilaterally -she usually responds well to a course of gentamicin a t --  She does not appear to have any visual changes or discomfort here   -    "  .  Family medical social history has been reviewed per history and physical on 08/24/2011  .  Medications  reviewed per [facility]  They include.  Benadryl 25 mg daily at bedtime when necessary.  Claritin 10 mg daily.  Colace 100 mg twice a day.  Due to locks tablet when necessary.  Fleets enema when necessary daily.  MiraLAX 17 g in 8 ounces of water daily.  Prilosec 20 mg daily.  Sulfasalazine 5 mg 3 times a day.  Systane eyedrops when necessary.  Trazodone 50 mg daily at bedtime when necessary insomnia .  Review of systems-stated in history of present illness again largely un attainable secondary to severe dementia --please see history of present illness-- apparently she has been at her baseline no complaints of chest pain shortness of breath--her urinary issues have been stable- Her cough appears to be improved Nursing staff has no some exudate from her eyes-.    Physical exam   Temperature 97.5 pulse 82 respirations 20 blood pressure 128/71 weight is stable to slightly increased at 167.8 .  .  General this is a frail but fairly  elderly female in no distress--lying comfortably in her bed .  Her skin is warm and dry-she does appear to have numerous seb keratoses which is baseline--   -  Eyes pupils equal round reactive to light sclera and conjunctiva are are slightly erythematous she does have somewhat of a cream-colored exudate bilaterally on her eyelids visual acuity appears grossly intact --conjunctivae are slightly erythematous   Oropharynx somewhat limited exam since patient did not really open her mouth   .  Chest -- clear to  auscultation without rhonchi rales or wheezes no labored breathing. poor respiratory effort-does not really follow verbal commands   Heart is regular rate and rhythm without murmur gallop or rub--does not have any significant lower extremity edema .  Abdomen soft nontender positive bowel sounds .  Muscle skeletal-do not note any deformities--she has actually walks at times with assistance- Moves all her extremities at baseline .   Neurologic as she does have severe dementia-cranial nerves appear grossly intact-I do not see any lateralizing findings.  Psych-again she does have severe dementia-has difficulty following verbal commands.  Pleasant smiling which appears to be her baseline  .  Labs   6/6 2016.  Sodium 140 potassium 3.9 BUN 16 creatinine 0.95.  WBC 6.3 hemoglobin 11.6 platelets 248   12/15/2014.  WBC 8.6 hemoglobin 12.5 platelets 225.  Sodium 142 potassium 4 BUN 19 creatinine 0.95 CO2 23  07/07/2014.  WBC 6.7 hemoglobin 12.7 platelets 257.  Sodium 141 potassium 4 BUN 15 creatinine 0.94-liver function tests within normal limits.    March 10 2014.  WBC 5.5 hemoglobin 11.7 platelets 242.  Sodium 141 potassium 4.1 BUN 15 creatinine 0.93 11/06/2013.  Sodium 142 potassium 4.1 BUN 15 creatinine 0.84.  Liver function tests within normal limits except albumin of 3.1.  WBC 5.1 hemoglobin 11.9 platelets 231  06/14/2013.  Sodium 136 potassium 3.8 BUN 18 creatinine 0.89  July 21st 2014.  WBC 5.2 hemoglobin 11.9 platelets 259.  Sodium 141 potassium 3.8 BUN 16 creatinine 1.02.  Liver function tests within normal limits except albumin of 3.2.  10/04/2012.  CBC 8.0 hemoglobin 12.5 platelets 307.  Sodium 139 potassium 4 BUN 14 creatinine 0.90.  06/18/2012.  Liver function tests within normal limits  Assessment and plan.  -  #1-dementia-this continues to be at baseline-she is functioning well in this setting-has a very supportive family --  #2-hypertension- This appears to be stable on no medications--will update a metabolic panel  #3 ulcerative colitis-continues to be stable on Sulfasalazine  #4 GERD-continues on Prilosec this does not appear to be an issue recently.  #5-allergic rhinitis -continues on Claritin.--This is stable and Claritin appears to help- r    #6-insomnia-she is on trazodone as needed.  #7-urinary retention-this has not been an issue  recently has been quite stable #8-conjunctivitis--as noted above we'll start gentamicin eyedrops again 0.3% 4 times a day to both eyes-monitor for resolution-we have discussed ophthalmology consult in the past with her husband  #9 anemia suspect this is of chronic disease Will update a CBC   [id number]      .

## 2015-05-28 ENCOUNTER — Encounter (HOSPITAL_COMMUNITY)
Admission: AD | Admit: 2015-05-28 | Discharge: 2015-05-28 | Disposition: A | Discharge status: Home / Self Care | Payer: Medicare Other | Source: Skilled Nursing Facility | Attending: Internal Medicine | Admitting: Internal Medicine

## 2015-05-28 DIAGNOSIS — I10 Essential (primary) hypertension: Secondary | ICD-10-CM | POA: Diagnosis present

## 2015-05-28 DIAGNOSIS — F0390 Unspecified dementia without behavioral disturbance: Secondary | ICD-10-CM | POA: Insufficient documentation

## 2015-05-28 DIAGNOSIS — D649 Anemia, unspecified: Secondary | ICD-10-CM | POA: Insufficient documentation

## 2015-05-28 LAB — CBC WITH DIFFERENTIAL/PLATELET
BASOS ABS: 0 10*3/uL (ref 0.0–0.1)
BASOS PCT: 0 %
EOS PCT: 7 %
Eosinophils Absolute: 0.6 10*3/uL (ref 0.0–0.7)
HCT: 40.5 % (ref 36.0–46.0)
Hemoglobin: 13.2 g/dL (ref 12.0–15.0)
Lymphocytes Relative: 28 %
Lymphs Abs: 2.4 10*3/uL (ref 0.7–4.0)
MCH: 31.4 pg (ref 26.0–34.0)
MCHC: 32.6 g/dL (ref 30.0–36.0)
MCV: 96.4 fL (ref 78.0–100.0)
MONOS PCT: 7 %
Monocytes Absolute: 0.6 10*3/uL (ref 0.1–1.0)
Neutro Abs: 4.9 10*3/uL (ref 1.7–7.7)
Neutrophils Relative %: 58 %
PLATELETS: 234 10*3/uL (ref 150–400)
RBC: 4.2 MIL/uL (ref 3.87–5.11)
RDW: 13.6 % (ref 11.5–15.5)
WBC: 8.6 10*3/uL (ref 4.0–10.5)

## 2015-05-28 LAB — COMPREHENSIVE METABOLIC PANEL
ALT: 10 U/L — AB (ref 14–54)
AST: 19 U/L (ref 15–41)
Albumin: 3.9 g/dL (ref 3.5–5.0)
Alkaline Phosphatase: 88 U/L (ref 38–126)
Anion gap: 6 (ref 5–15)
BILIRUBIN TOTAL: 0.5 mg/dL (ref 0.3–1.2)
BUN: 13 mg/dL (ref 6–20)
CO2: 24 mmol/L (ref 22–32)
Calcium: 9.2 mg/dL (ref 8.9–10.3)
Chloride: 110 mmol/L (ref 101–111)
Creatinine, Ser: 0.89 mg/dL (ref 0.44–1.00)
GFR calc Af Amer: >60 mL/min (ref >60)
GFR calc non Af Amer: 57 mL/min — ABNORMAL LOW (ref >60)
Glucose, Bld: 93 mg/dL (ref 65–99)
Potassium: 3.8 mmol/L (ref 3.5–5.1)
Sodium: 140 mmol/L (ref 135–145)
TOTAL PROTEIN: 7.5 g/dL (ref 6.5–8.1)

## 2015-07-23 ENCOUNTER — Non-Acute Institutional Stay (SKILLED_NURSING_FACILITY): Payer: Medicare Other | Admitting: Internal Medicine

## 2015-07-23 DIAGNOSIS — H109 Unspecified conjunctivitis: Secondary | ICD-10-CM

## 2015-07-23 DIAGNOSIS — I10 Essential (primary) hypertension: Secondary | ICD-10-CM

## 2015-07-23 DIAGNOSIS — K5100 Ulcerative (chronic) pancolitis without complications: Secondary | ICD-10-CM | POA: Diagnosis not present

## 2015-07-23 DIAGNOSIS — K219 Gastro-esophageal reflux disease without esophagitis: Secondary | ICD-10-CM | POA: Diagnosis not present

## 2015-07-23 DIAGNOSIS — G309 Alzheimer's disease, unspecified: Secondary | ICD-10-CM | POA: Diagnosis not present

## 2015-07-23 DIAGNOSIS — F0280 Dementia in other diseases classified elsewhere without behavioral disturbance: Secondary | ICD-10-CM | POA: Diagnosis not present

## 2015-07-23 NOTE — Progress Notes (Signed)
Patient ID: Theresa Ford, female   DOB: Aug 19, 1927, 81 y.o.   MRN: [id number]        this is a routine visit Level of care skilled.  Facility [facility].   Chief complaint medical management of dementia hypertension ulcerative colitis GERD anemia Urinary retention-acute visit secondary to eye exudate suspected recurrent conjunctivitis....  .  History of present illness.  Patient is a pleasant elderly resident with a history of significant dementia.  Nonetheless her stay here has been quite unremarkable and uneventful--she did develop a cough and apparently some upper respiratory congestion several months ago she dresponded to a course of Mucinex nebulizers as well as Avelox-I suspect she had some bronchitis      .Marland Kitchen...  Late last year she did develop some urinary retention--she has had a Foley catheter at times-and was treated for a UTI.--According to nursing staff she has been voiding fairly freely for an extended period now---   -  -  She does have severe dementia-Aricept and Namenda have been discontinued in the past because thought not to be effective  .  Does have a history of ulcerative colitis-she is on medication for this and this has not really been an issue.  Also listed history of hypertension but she is not on any medications and blood pressure has been well controlled in the past-most recently 123/86-   She did have some weight gain up till earlier this year then she has lost a bit of weight --this appears to have stabilized hovering around l-mid 160's-170 area-- -  She does have a history of conjunctivitis somewhat recurrent--apparently she has developed some conjunctivitis of  her eyes bilaterally -she usually responds well to a course of gentamicin a t --  She does not appear to have any visual changes or discomfort here   -    "  .  Family medical social history has been reviewed per history and physical on 08/24/2011  .  Medications reviewed  per [facility]  They include.  Benadryl 25 mg daily at bedtime when necessary.  Claritin 10 mg daily.  Colace 100 mg twice a day.  Dulcolax tablet when necessary.  Fleets enema when necessary daily.  MiraLAX 17 g in 8 ounces of water daily.  Prilosec 20 mg daily.  Sulfasalazine 5 mg 3 times a day.  Systane eyedrops when necessary.  Trazodone 50 mg daily at bedtime when necessary insomnia .  Review of systems-stated in history of present illness again largely un attainable secondary to severe dementia --please see history of present illness-- apparently she has been at her baseline no complaints of chest pain shortness of breath--her urinary issues have been stable-  Family  has no some exudate from her eyes-.    Physical exam    Temperature 97.7 pulse 78 respirations 20 blood pressure 123/86 weight is 170.6 .  .  General this is a frail but fairly  elderly female in no distress--lying comfortably in her bed .  Her skin is warm and dry-she does appear to have numerous seb keratoses which is baseline--   -  Eyes pupils equal round reactive to light sclera and conjunctiva are are slightly erythematous she does have somewhat of a cream-colored exudate bilaterally on her eyelidind in AM per her husband visual acuity appears grossly intact --conjunctivae are slightly erythematous   Oropharynx somewhat limited exam since patient did not really open her mouth   .  Chest -- clear to auscultation without rhonchi rales or wheezes no  labored breathing. poor respiratory effort-does not really follow verbal commands   Heart is regular rate and rhythm without murmur gallop or rub--does not have any significant lower extremity edema .  Abdomen soft nontender positive bowel sounds .  Muscle skeletal-do not note any deformities--she has actually walks at times with assistance- Moves all her extremities at baseline .  Neurologic as she does have severe dementia-cranial  nerves appear grossly intact-I do not see any lateralizing findings.  Psych-again she does have severe dementia-has difficulty following verbal commands.  Pleasant smiling which appears to be her baseline--she speaks minimally  .  Labs  05/28/2015.  Sodium 140 potassium 3.8 BUN 13 creatinine 0.89.  Liver function tests within normal limits except ALT of 10.  WBC 8.6 hemoglobin 13.2 platelets 234   6/6 2016.  Sodium 140 potassium 3.9 BUN 16 creatinine 0.95.  WBC 6.3 hemoglobin 11.6 platelets 248   12/15/2014.  WBC 8.6 hemoglobin 12.5 platelets 225.  Sodium 142 potassium 4 BUN 19 creatinine 0.95 CO2 23  07/07/2014.  WBC 6.7 hemoglobin 12.7 platelets 257.  Sodium 141 potassium 4 BUN 15 creatinine 0.94-liver function tests within normal limits.    March 10 2014.  WBC 5.5 hemoglobin 11.7 platelets 242.  Sodium 141 potassium 4.1 BUN 15 creatinine 0.93 11/06/2013.  Sodium 142 potassium 4.1 BUN 15 creatinine 0.84.  Liver function tests within normal limits except albumin of 3.1.  WBC 5.1 hemoglobin 11.9 platelets 231  06/14/2013.  Sodium 136 potassium 3.8 BUN 18 creatinine 0.89  July 21st 2014.  WBC 5.2 hemoglobin 11.9 platelets 259.  Sodium 141 potassium 3.8 BUN 16 creatinine 1.02.  Liver function tests within normal limits except albumin of 3.2.  10/04/2012.  CBC 8.0 hemoglobin 12.5 platelets 307.  Sodium 139 potassium 4 BUN 14 creatinine 0.90.  06/18/2012.  Liver function tests within normal limits  Assessment and plan.  -  #1-dementia-this continues to be at baseline-she is functioning well in this setting-has a very supportive family --  #2-hypertension- This appears to be stable on no medications--metabolic panel 05/28/2015 was unremarkable  #3 ulcerative colitis-continues to be stable on Sulfasalazine  #4 GERD-continues on Prilosec this does not appear to be an issue recently.  #5-allergic rhinitis -continues on  Claritin.--This is stable and Claritin appears to help- r    #6-insomnia-she is on trazodone as needed.  #7-urinary retention-this has not been an issue recently has been quite stable #8-conjunctivitis--as noted above we'll start gentamicin eyedrops again 0.3% 4 times a day to both eyes-monitor for resolution-we have discussed ophthalmology consult in the past with her husband  #9 anemia suspect this is of chronic disease hemoglobin of 13.2 on 05/28/2015 shows stability  [id number]      .

## 2015-08-07 ENCOUNTER — Non-Acute Institutional Stay (SKILLED_NURSING_FACILITY): Payer: Medicare Other | Admitting: Internal Medicine

## 2015-08-07 DIAGNOSIS — R0989 Other specified symptoms and signs involving the circulatory and respiratory systems: Secondary | ICD-10-CM

## 2015-08-07 DIAGNOSIS — J069 Acute upper respiratory infection, unspecified: Secondary | ICD-10-CM | POA: Diagnosis not present

## 2015-08-07 DIAGNOSIS — R509 Fever, unspecified: Secondary | ICD-10-CM | POA: Insufficient documentation

## 2015-08-07 NOTE — Progress Notes (Signed)
Patient ID: Theresa Ford, female   DOB: 05-15-1927, 81 y.o.   MRN: [id number]         this is an acute visit Level of care skilled.  [facility]   Chief complaint  Acute visit secondary to fever of unknown origin-suspect URI...  .  History of present illness.  Patient is a pleasant elderly resident with a history of significant dementia.  Nonetheless her stay here has been quite unremarkable and uneventful-- Most recent issues have been respiratory-related- several months ago treated with Avelox and Mucinex for suspected bronchitis and a URI-and this stabilized fairly quickly.  Apparently overnight she developed a significant temperature, 101-she also was noted to be congested-on call provider did start [place] QuickFortaz and she has been afebrile today O2 stats are satisfactory in the 90s on room air she appears to be back at her baseline.  Of note she was tachycardic last night but this appears to have stabilized as well with pulse of 96 today.  She is back up in her wheelchair pleasant and smiling which is her baseline      .  Family medical social history has been reviewed per history and physical on 08/24/2011  .  [facility]  They include.  Benadryl 25 mg daily at bedtime when necessary.  Claritin 10 mg daily.  Colace 100 mg twice a day.  Dulcolax tablet when necessary.  Fleets enema when necessary daily.  MiraLAX 17 g in 8 ounces of water daily.  Prilosec 20 mg daily.  Sulfasalazine 5 mg 3 times a day.  Systane eyedrops when necessary.  Trazodone 50 mg daily at bedtime when necessary insomnia .  Review of systems-stated in history of present illness again largely un attainable secondary to severe dementia -- .    Physical exam   .  Dr. 98.9 pulse 96 respirations 20 blood pressure 147/75 O2 saturation 95% on room air .  General this is a frail but fairly  elderly female in no distress--sitting comfortably in her wheelchair .   Her skin is warm and dry---   -  Eyes pupils equal round reactive to light sclera and conjunctiva are are clear  Oropharynx somewhat limited exam since patient did not really open her mouth   .  Chest --  Some slight congestion noted in the left lung base area there is no labored breathing she appears comfortable  Heart is regular rate and rhythm without murmur gallop or rub--does not have any significant lower extremity edema .  Abdomen soft nontender positive bowel sounds .  Muscle skeletal-do not note any deformities--she has actually walks at times with assistance- Moves all her extremities at baseline .  Neurologic as she does have severe dementia-cranial nerves appear grossly intact-I do not see any lateralizing findings.  Psych-again she does have severe dementia-has difficulty following verbal commands.  Pleasant smiling which appears to be her baseline--she speaks minimally  .  Labs  05/28/2015.  Sodium 140 potassium 3.8 BUN 13 creatinine 0.89.  Liver function tests within normal limits except ALT of 10.  WBC 8.6 hemoglobin 13.2 platelets 234   6/6 2016.  Sodium 140 potassium 3.9 BUN 16 creatinine 0.95.  WBC 6.3 hemoglobin 11.6 platelets 248   12/15/2014.  WBC 8.6 hemoglobin 12.5 platelets 225.  Sodium 142 potassium 4 BUN 19 creatinine 0.95 CO2 23  07/07/2014.  WBC 6.7 hemoglobin 12.7 platelets 257.  Sodium 141 potassium 4 BUN 15 creatinine 0.94-liver function tests within normal limits.  March 10 2014.  WBC 5.5 hemoglobin 11.7 platelets 242.  Sodium 141 potassium 4.1 BUN 15 creatinine 0.93 11/06/2013.  Sodium 142 potassium 4.1 BUN 15 creatinine 0.84.  Liver function tests within normal limits except albumin of 3.1.  WBC 5.1 hemoglobin 11.9 platelets 231  06/14/2013.  Sodium 136 potassium 3.8 BUN 18 creatinine 0.89  July 21st 2014.  WBC 5.2 hemoglobin 11.9 platelets 259.  Sodium 141 potassium 3.8 BUN 16 creatinine  1.02.  Liver function tests within normal limits except albumin of 3.2.  10/04/2012.  CBC 8.0 hemoglobin 12.5 platelets 307.  Sodium 139 potassium 4 BUN 14 creatinine 0.90.  06/18/2012.  Liver function tests within normal limits  Assessment and plan.  - #1 fever of unknown origin suspect Fidel Levy is receiving Fortaz-will continue this I through Saturday and then switch her to a weeklong course of Avelox 400 mg daily she has done well with Avelox before at this point would like to avoid long-term injections if she remains stable.  Also will add Robitussin routine for every 6 hours 72 hours and then when necessary-also Atrovent nebulizers every 6 hours when necessary for 5 days-clinically she appears to be stable she is now afebrile-at this point husband wants to defer any additional measures including a chest x-ray or lab work      [phone]      .

## 2015-08-12 ENCOUNTER — Non-Acute Institutional Stay (SKILLED_NURSING_FACILITY): Payer: Medicare Other | Admitting: Internal Medicine

## 2015-08-12 DIAGNOSIS — R05 Cough: Secondary | ICD-10-CM

## 2015-08-12 DIAGNOSIS — L0390 Cellulitis, unspecified: Secondary | ICD-10-CM | POA: Insufficient documentation

## 2015-08-12 DIAGNOSIS — R059 Cough, unspecified: Secondary | ICD-10-CM

## 2015-08-12 DIAGNOSIS — J069 Acute upper respiratory infection, unspecified: Secondary | ICD-10-CM | POA: Diagnosis not present

## 2015-08-12 DIAGNOSIS — L03114 Cellulitis of left upper limb: Secondary | ICD-10-CM | POA: Diagnosis not present

## 2015-08-12 NOTE — Progress Notes (Signed)
Patient ID: Theresa Ford, female   DOB: [birth year], 81 y.o.   MRN: [id number]          this is an acute visit Level of care skilled.  [facility]   Chief complaint  Acute visit secondary to left hand erythema-follow-up suspected URI .  Marland Kitchen.  History of present illness.  Patient is a pleasant elderly resident with a history of significant dementia.  Nonetheless her stay here has been quite unremarkable and uneventful-- Most recent issues have been respiratory-related- In late last week she did develop some cough and chest congestion as well as a temperature of 101.1.  This was thought to be respiratory related she was started on [place] this has subsequently been switched to Avelox.  She is also on Robitussin as well as Atrovent nebulizers when necessary.  The next day she actually appeared to be back at her baseline-and this persists.  She does continue with a somewhat deep cough according to her husband but otherwise appears to be her normal self.  She did have a skin tear apparently over the weekend I did look at this today with nursing staff and appears there is some mild erythema on the dorsum of her left hand where the skin tear occurred the wound is well approximated with Steri-Strips.  There is some minimal edema here not acute tenderness palpation--but according wound care nurse the erythema is increased from her previous exam which is her concern      .  Family medical social history has been reviewed per history and physical on 08/24/2011  .  [facility]  They include.  Benadryl 25 mg daily at bedtime when necessary.  Claritin 10 mg daily.  Colace 100 mg twice a day.  Dulcolax tablet when necessary.  Fleets enema when necessary daily.  MiraLAX 17 g in 8 ounces of water daily.  Prilosec 20 mg daily.  Sulfasalazine 5 mg 3 times a day.  Systane eyedrops when necessary.  Trazodone 50 mg daily at bedtime when necessary  insomnia  She is receiving Robitussin-DM 15 mL every 6 hours when necessary.  She is also on Atrovent nebulizers every 6 hours when necessary .  Review of systems-stated in history of present illness again largely un attainable secondary to severe dementia -- .    Physical exam    Temperature is 98.1 pulse 92 respirations 18 blood pressure 132/49 .  General this is a frail but fairly  elderly female in no distress--lying comfortably in bed .  Her skin is warm and dry--- On dorsum of her left hand there is a well approximated skin tear Steri-Strips are applied-there is some pale erythema extending from the site this is slightly warm to touch possibly slightly tender to palpation this is difficult to tell with patient's dementia-- according to nursing staff this erythema has increased from her previous exam   -  Eyes pupils equal round reactive to light sclera and conjunctiva are are clear  Oropharynx somewhat limited exam since patient did not really open her mouth   .  Chest --   Limited exam with poor respiratory effort but I did not hear any overt congestion-there is no labored breathing  Heart is regular rate and rhythm without murmur gallop or rub--does not have any significant lower extremity edema .    Marland Kitchen.  Muscle skeletal-do not note any deformities--she has actually walks at times with assistance- Moves all her extremities at baseline--she was up in the wheelchair earlier today .  Neurologic as she does have severe dementia-cranial nerves appear grossly intact-I do not see any lateralizing findings.  Psych-again she does have severe dementia-has difficulty following verbal commands.  Pleasant smiling which appears to be her baseline--she speaks minimally  .  Labs  05/28/2015.  Sodium 140 potassium 3.8 BUN 13 creatinine 0.89.  Liver function tests within normal limits except ALT of 10.  WBC 8.6 hemoglobin 13.2 platelets 234   6/6  2016.  Sodium 140 potassium 3.9 BUN 16 creatinine 0.95.  WBC 6.3 hemoglobin 11.6 platelets 248   12/15/2014.  WBC 8.6 hemoglobin 12.5 platelets 225.  Sodium 142 potassium 4 BUN 19 creatinine 0.95 CO2 23  07/07/2014.  WBC 6.7 hemoglobin 12.7 platelets 257.  Sodium 141 potassium 4 BUN 15 creatinine 0.94-liver function tests within normal limits.    March 10 2014.  WBC 5.5 hemoglobin 11.7 platelets 242.  Sodium 141 potassium 4.1 BUN 15 creatinine 0.93 11/06/2013.  Sodium 142 potassium 4.1 BUN 15 creatinine 0.84.  Liver function tests within normal limits except albumin of 3.1.  WBC 5.1 hemoglobin 11.9 platelets 231  06/14/2013.  Sodium 136 potassium 3.8 BUN 18 creatinine 0.89  July 21st 2014.  WBC 5.2 hemoglobin 11.9 platelets 259.  Sodium 141 potassium 3.8 BUN 16 creatinine 1.02.  Liver function tests within normal limits except albumin of 3.2.  10/04/2012.  CBC 8.0 hemoglobin 12.5 platelets 307.  Sodium 139 potassium 4 BUN 14 creatinine 0.90.  06/18/2012.  Liver function tests within normal limits  Assessment and plan.  - #URI-this appears to have stabilized she has been on antibiotic Fortaz switched to Avelox --antibiotics were started on December 9 -this is complicated somewhat with what appears to be some beginning cellulitis on her left hand.  Will switch her antibiotic to doxycycline for MRSA coverage-this also should have's some aid with the respiratory issues.  Clinically she appears stable-- the when necessary Robitussin and Atrovent nebulizers will have to be encouraged because of the continued cough  #2-history of left hand cellulitis-at this point appears fairly minimal but is progressing according to nursing staff-- treatment as noted above.  Also will start probiotics secondary to the continued antibiotic use.  [id number]         .

## 2015-09-15 ENCOUNTER — Non-Acute Institutional Stay (SKILLED_NURSING_FACILITY): Payer: Medicare Other | Admitting: Internal Medicine

## 2015-09-15 DIAGNOSIS — G309 Alzheimer's disease, unspecified: Secondary | ICD-10-CM | POA: Diagnosis not present

## 2015-09-15 DIAGNOSIS — I10 Essential (primary) hypertension: Secondary | ICD-10-CM | POA: Diagnosis not present

## 2015-09-15 DIAGNOSIS — H6123 Impacted cerumen, bilateral: Secondary | ICD-10-CM

## 2015-09-15 DIAGNOSIS — F0280 Dementia in other diseases classified elsewhere without behavioral disturbance: Secondary | ICD-10-CM

## 2015-09-15 DIAGNOSIS — K5100 Ulcerative (chronic) pancolitis without complications: Secondary | ICD-10-CM

## 2015-09-15 DIAGNOSIS — H6120 Impacted cerumen, unspecified ear: Secondary | ICD-10-CM | POA: Insufficient documentation

## 2015-09-15 NOTE — Progress Notes (Signed)
Patient ID: Theresa Ford, female   DOB: November 14, 1926, 82 y.o.   MRN: [id number]         this is a routine visit Level of care skilled.  Facility [facility]   Chief complaint medical management of dementia hypertension ulcerative colitis GERD anemia Urinary retention-acute visit secondary to suspected cerumen impaction....  .  History of present illness.  Patient is a pleasant elderly resident with a history of significant dementia.  Nonetheless her stay here has been quite unremarkable and uneventful--she did develop a cough and apparently some upper respiratory congestion back in December and this responded well to antibiotics including Fortaz-and eventually Avelox.  She is also treated for some left upper extremity cellulitis which appears to have resolved this was treated with doxycycline       .Marland Kitchen...   About a year agoshee did develop some urinary retention--she has had a Foley catheter at times-and was treated for a UTI.--According to nursing staff she has been voiding fairly freely for an extended period now---   -  -  She does have severe dementia-Aricept and Namenda have been discontinued in the past because thought not to be effective  .  Does have a history of ulcerative colitis-she is on medication for this and this has not really been an issue.  Also listed history of hypertension but she is not on any medications and blood pressure has been well controlled in the past-most recently 138/79-   She did have some weight gain up till earlier this year then she has lost a bit of weight --this appears to have stabilized hovering around l-mid 160's-170 area most recently 169-- -  She does have a history of conjunctivitis at times-  -she usually responds well to a course of gentamicin a t --  She does not appear to have any visual changes or discomfort today  Her husband states she does appear to have somewhat more difficulty hearing and appears she has some ear  irritation-it is thought that she's done this in the past when she's had a wax impaction   -    "  .  Family medical social history has been reviewed per history and physical on 08/24/2011  .  Medications reviewed per [facility]  They include.  Benadryl 25 mg daily at bedtime when necessary.  Claritin 10 mg daily.  Colace 100 mg twice a day.  Dulcolax tablet when necessary.  Fleets enema when necessary daily.  MiraLAX 17 g in 8 ounces of water daily.  Prilosec 20 mg daily.  Sulfasalazine 5 mg 3 times a day.  Systane eyedrops when necessary.  Trazodone 50 mg daily at bedtime when necessary insomnia .  Review of systems-stated in history of present illness again largely un attainable secondary to severe dementia --please see history of present illness-- apparently she has been at her baseline no complaints of chest pain shortness of breath--her urinary issues have been stable- Her husband feels she's probably had some recent ear difficulties as noted above.    Physical exam    Temperature 97.2 pulse 77 respirations 20 blood pressure 138/79 weight is stable at 169 .  Marland Kitchen  General this is a frail but fairly  elderly female in no distress--sitting comfortably in her wheelchair .  Her skin is warm and dry-she does appear to have numerous seb keratoses which is baseline--   -  Eyes pupils equal round reactive to light sclera and conjunctiva areclear--visual acuity appears grossly intact.  Ears it does appear  she has wax buildup in her ears although tympanic membrane appears to be visualized  bilat-somewhat more wax it appears in the right versus left ear    Oropharynx somewhat limited exam since patient did not really open her mouth   .  Chest -- clear to auscultation without rhonchi rales or wheezes no labored breathing. poor respiratory effort-does not really follow verbal commands   Heart is regular rate and rhythm without murmur gallop or rub--trace  lower extremity edema .  Abdomen soft nontender positive bowel sounds .  Muscle skeletal-do not note any deformities--she has actually walks at times with assistance- Moves all her extremities at baseline .  Neurologic as she does have severe dementia-cranial nerves appear grossly intact-I do not see any lateralizing findings.  Psych-again she does have severe dementia-has difficulty following verbal commands.  Pleasant smiling which appears to be her baseline--she speaks minimally  .  Labs  05/28/2015.  Sodium 140 potassium 3.8 BUN 13 creatinine 0.89.  Liver function tests within normal limits except ALT of 10.  WBC 8.6 hemoglobin 13.2 platelets 234   6/6 2016.  Sodium 140 potassium 3.9 BUN 16 creatinine 0.95.  WBC 6.3 hemoglobin 11.6 platelets 248   12/15/2014.  WBC 8.6 hemoglobin 12.5 platelets 225.  Sodium 142 potassium 4 BUN 19 creatinine 0.95 CO2 23  07/07/2014.  WBC 6.7 hemoglobin 12.7 platelets 257.  Sodium 141 potassium 4 BUN 15 creatinine 0.94-liver function tests within normal limits.    March 10 2014.  WBC 5.5 hemoglobin 11.7 platelets 242.  Sodium 141 potassium 4.1 BUN 15 creatinine 0.93 11/06/2013.  Sodium 142 potassium 4.1 BUN 15 creatinine 0.84.  Liver function tests within normal limits except albumin of 3.1.  WBC 5.1 hemoglobin 11.9 platelets 231  06/14/2013.  Sodium 136 potassium 3.8 BUN 18 creatinine 0.89  July 21st 2014.  WBC 5.2 hemoglobin 11.9 platelets 259.  Sodium 141 potassium 3.8 BUN 16 creatinine 1.02.  Liver function tests within normal limits except albumin of 3.2.  10/04/2012.  CBC 8.0 hemoglobin 12.5 platelets 307.  Sodium 139 potassium 4 BUN 14 creatinine 0.90.  06/18/2012.  Liver function tests within normal limits  Assessment and plan.  -  #1-dementia-this continues to be at baseline-she is functioning well in this setting-has a very supportive family --  #2-hypertension- This appears  to be stable on no medications--metabolic panel 05/28/2015 was unremarkable  #3 ulcerative colitis-continues to be stable on Sulfasalazine  #4 GERD-continues on Prilosec this does not appear to be an issue recently.  #5-allergic rhinitis -continues on Claritin.--This is stable and Claritin appears to help- r    #6-insomnia-she is on trazodone as needed.  #7-urinary retention-this has not been an issue recently has been quite stable #8-conjunctivitis-- At this point does not appear to be an issue although there is recurrence at times  #9 anemia suspect this is of chronic disease hemoglobin of 13.2 on 05/28/2015 shows stability  #10 cerumen buildup-will treat with Debrox to both ears for 3 days twice a day and flush with warm water on day 4 if no resolution if symptoms persist certainly will have to readdress  Will update CBC BMP if okay with family -- family does not really wish for extensive lab draws- her labs appear to be quite consistent on a regular basis  [id number]        .

## 2015-10-06 ENCOUNTER — Non-Acute Institutional Stay (SKILLED_NURSING_FACILITY): Payer: Medicare Other | Admitting: Internal Medicine

## 2015-10-06 DIAGNOSIS — K5190 Ulcerative colitis, unspecified, without complications: Secondary | ICD-10-CM

## 2015-10-06 DIAGNOSIS — F0280 Dementia in other diseases classified elsewhere without behavioral disturbance: Secondary | ICD-10-CM | POA: Diagnosis not present

## 2015-10-06 DIAGNOSIS — I10 Essential (primary) hypertension: Secondary | ICD-10-CM | POA: Diagnosis not present

## 2015-10-06 DIAGNOSIS — G309 Alzheimer's disease, unspecified: Secondary | ICD-10-CM

## 2015-10-06 DIAGNOSIS — K219 Gastro-esophageal reflux disease without esophagitis: Secondary | ICD-10-CM | POA: Diagnosis not present

## 2015-10-06 NOTE — Progress Notes (Signed)
Patient ID: Theresa Ford, female   DOB: 03-11-1927, 82 y.o.   MRN: [id number]          this is a routine visit Level of care skilled.  Facility [facility].   Chief complaint medical management of dementia hypertension ulcerative colitis GERD anemia ...  .  History of present illness.  Patient is a pleasant elderly resident with a history of significant dementia.  Nonetheless her stay here has been quite unremarkable and uneventful--she did develop a cough and apparently some upper respiratory congestion back in December and this responded well to antibiotics including Fortaz-and eventually Avelox.  She is also treated for some left upper extremity cellulitis which appears to have resolved this was treated with doxycycline  Nursing staff and her husband have noticed that recently she's had some increased humming-type episodes-I did discuss this fairly extensively with her husband-I suspect this may be due to progressing dementia-she continues to eat fairly well with supportive care.  And continues to do well in this setting with supportive care--at one point she been apparently on Aricept and Namenda but it apparently was not effective       .Marland Kitchen...   About a year agoshee did develop some urinary retention--she has had a Foley catheter at times-and was treated for a UTI.--According to nursing staff she has been voiding fairly freely for an extended period now---   -  -    .  Does have a history of ulcerative colitis-she is on medication for this and this has not really been an issue.  Also listed history of hypertension but she is not on any medications and blood pressure has been well controlled in the past-most recently 127/59   She did have some weight gain up till earlier this year then she has lost a bit of weight --this appears to have stabilized hovering around l-mid 160's-170 area most recently 168- -  She does have a history of conjunctivitis at times-  -she  usually responds well to a course of gentamicin a t --  She does not appear to have any visual changes or discomfort today    -    "  .  Family medical social history has been reviewed per history and physical on 08/24/2011  .  Medications reviewed per [facility]  They include.  Benadryl 25 mg daily at bedtime when necessary.  Claritin 10 mg daily.  Colace 100 mg twice a day.  Dulcolax tablet when necessary.  Fleets enema when necessary daily.  MiraLAX 17 g in 8 ounces of water daily.  Prilosec 20 mg daily.  Sulfasalazine 5 mg 3 times a day.  Systane eyedrops when necessary.  Trazodone 50 mg daily at bedtime when necessary insomnia .  Review of systems-stated in history of present illness again largely un attainable secondary to severe dementia --please see history of present illness-- apparently she has been at her baseline no complaints of chest pain shortness of breath--her urinary issues have been stable- Her husband feels she's probably had some recent episodes of increased humming-type behavior.    Physical exam     Temperature 97.9 pulse 79 respirations 18 blood pressure 127/59 weight is stable at 167.8 .  .  General this is a frail but fairly  elderly female in no distress--sitting comfortably in her wheelchair--she does make eye contact is when I initially evaluated her she was not  Humming--- coming back later in the afternoon she was having this although this did not appear to be  overtly distracting .  Her skin is warm and dry-she does appear to have numerous seb keratoses which is baseline--   -  Eyes pupils equal round reactive to light sclera and conjunctiva areclear--visual acuity appears grossly intact.      Oropharynx somewhat limited exam since patient did not really open her mouth   .  Chest -- clear to auscultation without rhonchi rales or wheezes no labored breathing. poor respiratory effort-does not really follow verbal  commands   Heart is regular rate and rhythm without murmur gallop or rub--trace lower extremity edema .  Abdomen soft nontender positive bowel sounds .  Muscle skeletal-do not note any deformities--she has actually walks at times with assistance- Moves all her extremities at baseline .  Neurologic as she does have severe dementia-cranial nerves appear grossly intact-I do not see any lateralizing findings.  Psych-again she does have severe dementia-has difficulty following verbal commands.  Pleasant smiling which appears to be her baseline--she speaks minimally--has some increased humming episodes at times  .  Labs  05/28/2015.  Sodium 140 potassium 3.8 BUN 13 creatinine 0.89.  Liver function tests within normal limits except ALT of 10.  WBC 8.6 hemoglobin 13.2 platelets 234   6/6 2016.  Sodium 140 potassium 3.9 BUN 16 creatinine 0.95.  WBC 6.3 hemoglobin 11.6 platelets 248   12/15/2014.  WBC 8.6 hemoglobin 12.5 platelets 225.  Sodium 142 potassium 4 BUN 19 creatinine 0.95 CO2 23  07/07/2014.  WBC 6.7 hemoglobin 12.7 platelets 257.  Sodium 141 potassium 4 BUN 15 creatinine 0.94-liver function tests within normal limits.    March 10 2014.  WBC 5.5 hemoglobin 11.7 platelets 242.  Sodium 141 potassium 4.1 BUN 15 creatinine 0.93 11/06/2013.  Sodium 142 potassium 4.1 BUN 15 creatinine 0.84.  Liver function tests within normal limits except albumin of 3.1.  WBC 5.1 hemoglobin 11.9 platelets 231  06/14/2013.  Sodium 136 potassium 3.8 BUN 18 creatinine 0.89  July 21st 2014.  WBC 5.2 hemoglobin 11.9 platelets 259.  Sodium 141 potassium 3.8 BUN 16 creatinine 1.02.  Liver function tests within normal limits except albumin of 3.2.  10/04/2012.  CBC 8.0 hemoglobin 12.5 platelets 307.  Sodium 139 potassium 4 BUN 14 creatinine 0.90.  06/18/2012.  Liver function tests within normal limits  Assessment and plan.  -  #1-dementia-this  continues to be at baseline-she is functioning well in this setting-has a very supportive family --she has some increased humming episodes-I believe a psych consult is pending  #2-hypertension- This appears to be stable on no medications--metabolic panel 05/28/2015 was unremarkable  #3 ulcerative colitis-continues to be stable on Sulfasalazine  #4 GERD-continues on Prilosec this does not appear to be an issue recently.  #5-allergic rhinitis -continues on Claritin.--This is stable and Claritin appears to help- r    #6-insomnia-she is on trazodone as needed.  #7-urinary retention-this has not been an issue recently has been quite stable #8-conjunctivitis-- At this point does not appear to be an issue although there is recurrence at times  #9 anemia suspect this is of chronic disease hemoglobin of 13.2 on 05/28/2015 shows stability     [id number]        .

## 2015-10-28 DIAGNOSIS — N390 Urinary tract infection, site not specified: Secondary | ICD-10-CM

## 2015-10-28 HISTORY — DX: Urinary tract infection, site not specified: N39.0

## 2015-11-17 ENCOUNTER — Non-Acute Institutional Stay (SKILLED_NURSING_FACILITY): Payer: Medicare Other | Admitting: Internal Medicine

## 2015-11-17 DIAGNOSIS — G309 Alzheimer's disease, unspecified: Secondary | ICD-10-CM

## 2015-11-17 DIAGNOSIS — R627 Adult failure to thrive: Secondary | ICD-10-CM

## 2015-11-17 DIAGNOSIS — F0280 Dementia in other diseases classified elsewhere without behavioral disturbance: Secondary | ICD-10-CM | POA: Diagnosis not present

## 2015-11-17 NOTE — Progress Notes (Signed)
Patient ID: Keturah BarreBettie Storts, female   DOB: 06-09-1927, 82 y.o.   MRN: [id number]           this is an acute visit Level of care skilled.  [facility]   Chief complaint --acute visit secondary to decline in mental status-questionable constipation..  .  History of present illness.  Patient is a pleasant elderly resident with a history of significant dementia.  Nonetheless her stay here has been quite unremarkable and uneventful--s At times she will have conjunctivitis respiratory issues with these resolved fairly unremarkably. She also has a distant history of urinary retention and occasional UTIs.  Patient has had a decline here over the past several weeks-has been humming more-by mouth intake has been decreased-there is some suspicion this may be progression of her dementia-husband also feels possibly she may have constipation she is on agents including Colace twice a day as well as Dulcolax suppository when necessary and a fleets enema when necessary.  Appear she has had bowel movements recently although documentation of these is somewhat challenging-.  I did have a fairly extensive discussion with her husband today at bedside-at this point he does not really want aggressive measures labs x-rays cultures-.  Apparently she does have some continued wax in her ears and will order D Brox.  Vital signs actually appear to be stable her weight has had a decline appears about 5 pounds since the beginning of the month       -    "  .  Family medical social history has been reviewed per history and physical on 08/24/2011  .  [facility]  They include.  Benadryl 25 mg daily at bedtime when necessary.  Claritin 10 mg daily.  Colace 100 mg twice a day.  Dulcolax tablet when necessary.  Fleets enema when necessary daily.  MiraLAX 17 g in 8 ounces of water daily.  Prilosec 20 mg daily.  Sulfasalazine 5 mg 3 times a day.  Systane eyedrops  when necessary.  Trazodone 50 mg daily at bedtime when necessary insomnia .  Review of systems-stated in history of present illness again largely un attainable secondary to severe dementia --please see history of present illness--     Physical exam   Temperature 98.1 pulse 78 respirations 18 blood pressure 132/69 weight is 160.2 this appears to be decline of about 5 pounds since the beginning of the month.  .  General this is a frail but fairly  elderly female in no distress-- But appears to be declining is humming in her wheelchair appears somewhat weaker .  Her skin is warm and dry-she does appear to have numerous seb keratoses which is baseline--   -  Eyes pupils equal round reactive to light sclera and conjunctiva areclear--visual acuity appears grossly intact.      Oropharynx somewhat limited exam since patient did not really open her mouth   .  Chest -- clear to auscultation without  over rhonchi rales or wheezes no labored breathing. poor respiratory effort  makes exam somewhat difficult-does not really follow verbal commands   Heart is regular rate and rhythm without murmur gallop or rub--trace lower extremity edema .  Abdomen soft nontender positive bowel sounds .  Muscle skeletal-do not note any deformities--she has actually walks at times with assistance-but has not been doing this recently secondary to decline .  Neurologic as she does have severe dementia-cranial nerves appear grossly intact-I do not see any lateralizing findings.  Psych-again she does have  severe dementia-has difficulty following verbal commands.   Usually she is smiling but does not really attempt this today-  .  Labs  05/28/2015.  Sodium 140 potassium 3.8 BUN 13 creatinine 0.89.  Liver function tests within normal limits except ALT of 10.  WBC 8.6 hemoglobin 13.2 platelets 234   6/6 2016.  Sodium 140 potassium 3.9 BUN 16 creatinine 0.95.  WBC 6.3 hemoglobin  11.6 platelets 248   12/15/2014.  WBC 8.6 hemoglobin 12.5 platelets 225.  Sodium 142 potassium 4 BUN 19 creatinine 0.95 CO2 23  07/07/2014.  WBC 6.7 hemoglobin 12.7 platelets 257.  Sodium 141 potassium 4 BUN 15 creatinine 0.94-liver function tests within normal limits.      Assessment and plan.  -   Dementia-altered mental status with failure to thrive-this may be progression of her dementia this was discussed with her husband cannot  rule out an acute process here either infection renal injury etc.-I did speak with her son Viviann Spare who is actually a physician in town-at this point will monitor per family desires-but if this progresses or persists suspect we may be more aggressive if family desires.-Ordering labs x-rays urine culture-  Also will order D Brox for history of wax buildup in her ears  CPT-99309          .

## 2015-11-19 ENCOUNTER — Ambulatory Visit (HOSPITAL_COMMUNITY): Payer: Medicare Other

## 2015-11-19 ENCOUNTER — Ambulatory Visit (HOSPITAL_COMMUNITY)
Admission: RE | Admit: 2015-11-19 | Discharge: 2015-11-19 | Disposition: A | Discharge status: Home / Self Care | Payer: Medicare Other | Source: Ambulatory Visit | Attending: Internal Medicine | Admitting: Internal Medicine

## 2015-11-19 ENCOUNTER — Encounter (HOSPITAL_COMMUNITY)
Admission: RE | Admit: 2015-11-19 | Discharge: 2015-11-19 | Disposition: A | Discharge status: Home / Self Care | Payer: Medicare Other | Source: Skilled Nursing Facility | Attending: Internal Medicine | Admitting: Internal Medicine

## 2015-11-19 ENCOUNTER — Non-Acute Institutional Stay (SKILLED_NURSING_FACILITY): Payer: Medicare Other | Admitting: Internal Medicine

## 2015-11-19 ENCOUNTER — Encounter (HOSPITAL_COMMUNITY): Payer: Self-pay | Admitting: *Deleted

## 2015-11-19 ENCOUNTER — Inpatient Hospital Stay (HOSPITAL_COMMUNITY)
Admission: AD | Admit: 2015-11-19 | Discharge: 2015-11-24 | DRG: 682 | Disposition: A | Discharge status: Skilled Nursing Facility | Payer: Medicare Other | Source: Ambulatory Visit | Attending: Internal Medicine | Admitting: Internal Medicine

## 2015-11-19 DIAGNOSIS — G309 Alzheimer's disease, unspecified: Secondary | ICD-10-CM

## 2015-11-19 DIAGNOSIS — E870 Hyperosmolality and hypernatremia: Secondary | ICD-10-CM | POA: Diagnosis present

## 2015-11-19 DIAGNOSIS — K5909 Other constipation: Secondary | ICD-10-CM

## 2015-11-19 DIAGNOSIS — K5641 Fecal impaction: Secondary | ICD-10-CM | POA: Diagnosis present

## 2015-11-19 DIAGNOSIS — R627 Adult failure to thrive: Secondary | ICD-10-CM

## 2015-11-19 DIAGNOSIS — F0280 Dementia in other diseases classified elsewhere without behavioral disturbance: Secondary | ICD-10-CM | POA: Diagnosis present

## 2015-11-19 DIAGNOSIS — I10 Essential (primary) hypertension: Secondary | ICD-10-CM | POA: Diagnosis present

## 2015-11-19 DIAGNOSIS — R739 Hyperglycemia, unspecified: Secondary | ICD-10-CM | POA: Diagnosis present

## 2015-11-19 DIAGNOSIS — Z452 Encounter for adjustment and management of vascular access device: Secondary | ICD-10-CM

## 2015-11-19 DIAGNOSIS — E860 Dehydration: Secondary | ICD-10-CM | POA: Diagnosis present

## 2015-11-19 DIAGNOSIS — Z880 Allergy status to penicillin: Secondary | ICD-10-CM | POA: Diagnosis not present

## 2015-11-19 DIAGNOSIS — N179 Acute kidney failure, unspecified: Secondary | ICD-10-CM | POA: Diagnosis present

## 2015-11-19 DIAGNOSIS — D649 Anemia, unspecified: Secondary | ICD-10-CM | POA: Diagnosis present

## 2015-11-19 DIAGNOSIS — N390 Urinary tract infection, site not specified: Secondary | ICD-10-CM | POA: Diagnosis present

## 2015-11-19 DIAGNOSIS — L03114 Cellulitis of left upper limb: Secondary | ICD-10-CM | POA: Insufficient documentation

## 2015-11-19 DIAGNOSIS — K5900 Constipation, unspecified: Secondary | ICD-10-CM | POA: Diagnosis present

## 2015-11-19 DIAGNOSIS — Z6827 Body mass index (BMI) 27.0-27.9, adult: Secondary | ICD-10-CM

## 2015-11-19 DIAGNOSIS — E43 Unspecified severe protein-calorie malnutrition: Secondary | ICD-10-CM | POA: Diagnosis present

## 2015-11-19 DIAGNOSIS — D72829 Elevated white blood cell count, unspecified: Secondary | ICD-10-CM | POA: Diagnosis not present

## 2015-11-19 DIAGNOSIS — Z87440 Personal history of urinary (tract) infections: Secondary | ICD-10-CM | POA: Diagnosis not present

## 2015-11-19 DIAGNOSIS — B964 Proteus (mirabilis) (morganii) as the cause of diseases classified elsewhere: Secondary | ICD-10-CM | POA: Diagnosis present

## 2015-11-19 LAB — CBC WITH DIFFERENTIAL/PLATELET
BASOS PCT: 0 %
Basophils Absolute: 0.1 10*3/uL (ref 0.0–0.1)
EOS PCT: 4 %
Eosinophils Absolute: 0.6 10*3/uL (ref 0.0–0.7)
HCT: 43.7 % (ref 36.0–46.0)
HEMOGLOBIN: 13.3 g/dL (ref 12.0–15.0)
LYMPHS ABS: 2.7 10*3/uL (ref 0.7–4.0)
Lymphocytes Relative: 17 %
MCH: 30.4 pg (ref 26.0–34.0)
MCHC: 30.4 g/dL (ref 30.0–36.0)
MCV: 100 fL (ref 78.0–100.0)
MONO ABS: 0.7 10*3/uL (ref 0.1–1.0)
MONOS PCT: 5 %
Neutro Abs: 11.8 10*3/uL — ABNORMAL HIGH (ref 1.7–7.7)
Neutrophils Relative %: 74 %
Platelets: 257 10*3/uL (ref 150–400)
RBC: 4.37 MIL/uL (ref 3.87–5.11)
RDW: 16.4 % — ABNORMAL HIGH (ref 11.5–15.5)
WBC: 15.8 10*3/uL — ABNORMAL HIGH (ref 4.0–10.5)

## 2015-11-19 LAB — BASIC METABOLIC PANEL
Anion gap: 7 (ref 5–15)
BUN: 45 mg/dL — AB (ref 4–21)
BUN: 42 mg/dL — AB (ref 6–20)
CHLORIDE: 128 mmol/L — AB (ref 101–111)
CO2: 25 mmol/L (ref 22–32)
Calcium: 8.9 mg/dL (ref 8.9–10.3)
Creatinine, Ser: 1.37 mg/dL — ABNORMAL HIGH (ref 0.44–1.00)
Creatinine: 1.5 mg/dL — AB (ref <=1.1)
GFR, EST AFRICAN AMERICAN: 39 mL/min — AB (ref >60)
GFR, EST NON AFRICAN AMERICAN: 34 mL/min — AB (ref >60)
GLUCOSE: 163 mg/dL
Glucose, Bld: 103 mg/dL — ABNORMAL HIGH (ref 65–99)
POTASSIUM: 3.3 mmol/L — AB (ref 3.5–5.1)
SODIUM: 163 mmol/L — AB (ref 137–147)
SODIUM: 160 mmol/L — AB (ref 135–145)

## 2015-11-19 LAB — URINALYSIS, ROUTINE W REFLEX MICROSCOPIC
BILIRUBIN URINE: NEGATIVE
Glucose, UA: NEGATIVE mg/dL
KETONES UR: NEGATIVE mg/dL
NITRITE: NEGATIVE
Protein, ur: 30 mg/dL — AB
SPECIFIC GRAVITY, URINE: 1.01 (ref 1.005–1.030)
pH: 7.5 (ref 5.0–8.0)

## 2015-11-19 LAB — COMPREHENSIVE METABOLIC PANEL
ALK PHOS: 88 U/L (ref 38–126)
ALT: 14 U/L (ref 14–54)
AST: 21 U/L (ref 15–41)
Albumin: 3.7 g/dL (ref 3.5–5.0)
BUN: 45 mg/dL — ABNORMAL HIGH (ref 6–20)
CO2: 21 mmol/L — AB (ref 22–32)
Calcium: 9.6 mg/dL (ref 8.9–10.3)
Creatinine, Ser: 1.52 mg/dL — ABNORMAL HIGH (ref 0.44–1.00)
GFR calc non Af Amer: 30 mL/min — ABNORMAL LOW (ref >60)
GFR, EST AFRICAN AMERICAN: 35 mL/min — AB (ref >60)
GLUCOSE: 163 mg/dL — AB (ref 65–99)
POTASSIUM: 3.5 mmol/L (ref 3.5–5.1)
SODIUM: 163 mmol/L — AB (ref 135–145)
Total Bilirubin: 0.7 mg/dL (ref 0.3–1.2)
Total Protein: 7.7 g/dL (ref 6.5–8.1)

## 2015-11-19 LAB — TROPONIN I
Troponin I: <0.03 ng/mL (ref <0.031)
Troponin I: <0.03 ng/mL (ref <0.031)

## 2015-11-19 LAB — GLUCOSE, CAPILLARY
GLUCOSE-CAPILLARY: 69 mg/dL (ref 65–99)
GLUCOSE-CAPILLARY: 115 mg/dL — AB (ref 65–99)

## 2015-11-19 LAB — TSH: TSH: 2.308 u[IU]/mL (ref 0.350–4.500)

## 2015-11-19 LAB — URINE MICROSCOPIC-ADD ON

## 2015-11-19 LAB — MRSA PCR SCREENING: MRSA BY PCR: POSITIVE — AB

## 2015-11-19 LAB — PROCALCITONIN

## 2015-11-19 LAB — MAGNESIUM: Magnesium: 2.9 mg/dL — ABNORMAL HIGH (ref 1.7–2.4)

## 2015-11-19 LAB — CBC AND DIFFERENTIAL: WBC: 15.8 10*3/mL

## 2015-11-19 MED ORDER — ACETAMINOPHEN 325 MG PO TABS: 650.0000 mg | ORAL_TABLET | ORAL | Status: DC | PRN

## 2015-11-19 MED ORDER — LEVOFLOXACIN IN D5W 750 MG/150ML IV SOLN
750.0000 mg | Freq: Once | INTRAVENOUS | Status: AC
  Administered 2015-11-19: 750 mg via INTRAVENOUS
  Filled 2015-11-19: qty 150

## 2015-11-19 MED ORDER — FLUOCINONIDE 0.05 % EX CREA
1.0000 "application " | TOPICAL_CREAM | Freq: Two times a day (BID) | CUTANEOUS | Status: DC
  Administered 2015-11-19 – 2015-11-22 (×6): 1 via TOPICAL
  Filled 2015-11-19: qty 30

## 2015-11-19 MED ORDER — POLYVINYL ALCOHOL 1.4 % OP SOLN: 1.0000 [drp] | Freq: Two times a day (BID) | OPHTHALMIC | Status: DC | PRN

## 2015-11-19 MED ORDER — GUAIFENESIN-DM 100-10 MG/5ML PO SYRP: 5.0000 mL | ORAL_SOLUTION | Freq: Four times a day (QID) | ORAL | Status: DC | PRN

## 2015-11-19 MED ORDER — IPRATROPIUM BROMIDE 0.02 % IN SOLN: 0.5000 mg | Freq: Four times a day (QID) | RESPIRATORY_TRACT | Status: DC | PRN

## 2015-11-19 MED ORDER — INSULIN ASPART 100 UNIT/ML ~~LOC~~ SOLN
0.0000 [IU] | SUBCUTANEOUS | Status: DC
  Administered 2015-11-20 – 2015-11-24 (×9): 1 [IU] via SUBCUTANEOUS

## 2015-11-19 MED ORDER — MAGNESIUM SULFATE 2 GM/50ML IV SOLN
2.0000 g | Freq: Once | INTRAVENOUS | Status: AC
  Administered 2015-11-19: 2 g via INTRAVENOUS
  Filled 2015-11-19: qty 50

## 2015-11-19 MED ORDER — ONDANSETRON HCL 4 MG/2ML IJ SOLN: 4.0000 mg | Freq: Four times a day (QID) | INTRAMUSCULAR | Status: DC | PRN

## 2015-11-19 MED ORDER — MINERAL OIL RE ENEM
1.0000 | ENEMA | Freq: Once | RECTAL | Status: AC
  Administered 2015-11-19: 1 via RECTAL
  Filled 2015-11-19: qty 1

## 2015-11-19 MED ORDER — ONDANSETRON HCL 4 MG PO TABS: 4.0000 mg | ORAL_TABLET | Freq: Four times a day (QID) | ORAL | Status: DC | PRN

## 2015-11-19 MED ORDER — CARBAMIDE PEROXIDE 6.5 % OT SOLN
10.0000 [drp] | Freq: Two times a day (BID) | OTIC | Status: DC
  Administered 2015-11-19 – 2015-11-22 (×4): 10 [drp] via OTIC
  Filled 2015-11-19 (×3): qty 15

## 2015-11-19 MED ORDER — TRAZODONE HCL 50 MG PO TABS
50.0000 mg | ORAL_TABLET | Freq: Every evening | ORAL | Status: DC | PRN
  Filled 2015-11-19: qty 1

## 2015-11-19 MED ORDER — SULFASALAZINE 500 MG PO TABS
1000.0000 mg | ORAL_TABLET | Freq: Three times a day (TID) | ORAL | Status: DC
  Administered 2015-11-20 – 2015-11-24 (×9): 1000 mg via ORAL
  Filled 2015-11-19 (×18): qty 2

## 2015-11-19 MED ORDER — DEXTROSE 5 % IV SOLN
INTRAVENOUS | Status: DC
  Administered 2015-11-19: 18:00:00 via INTRAVENOUS

## 2015-11-19 MED ORDER — DEXTROSE 5 % IV SOLN
INTRAVENOUS | Status: AC
  Filled 2015-11-19 (×2): qty 1

## 2015-11-19 MED ORDER — DEXTROSE 5 % IV SOLN
1.0000 g | Freq: Three times a day (TID) | INTRAVENOUS | Status: DC
  Administered 2015-11-20 – 2015-11-21 (×6): 1 g via INTRAVENOUS
  Filled 2015-11-19 (×12): qty 1

## 2015-11-19 MED ORDER — LORAZEPAM 2 MG/ML IJ SOLN
0.5000 mg | INTRAMUSCULAR | Status: DC | PRN
Start: 2015-11-19 — End: 2015-11-24
  Administered 2015-11-19 – 2015-11-20 (×3): 0.5 mg via INTRAVENOUS
  Filled 2015-11-19 (×3): qty 1

## 2015-11-19 MED ORDER — HEPARIN SODIUM (PORCINE) 5000 UNIT/ML IJ SOLN
5000.0000 [IU] | Freq: Three times a day (TID) | INTRAMUSCULAR | Status: DC
  Administered 2015-11-19 – 2015-11-24 (×14): 5000 [IU] via SUBCUTANEOUS
  Filled 2015-11-19 (×12): qty 1

## 2015-11-19 MED ORDER — POTASSIUM CHLORIDE 10 MEQ/100ML IV SOLN
10.0000 meq | INTRAVENOUS | Status: AC
  Administered 2015-11-19 – 2015-11-20 (×3): 10 meq via INTRAVENOUS
  Filled 2015-11-19: qty 100

## 2015-11-19 MED ORDER — LEVOFLOXACIN IN D5W 500 MG/100ML IV SOLN
500.0000 mg | INTRAVENOUS | Status: DC
  Administered 2015-11-21: 500 mg via INTRAVENOUS
  Filled 2015-11-19: qty 100

## 2015-11-19 MED ORDER — DEXTROSE 5 % IV SOLN
INTRAVENOUS | Status: DC
  Administered 2015-11-19 – 2015-11-21 (×3): via INTRAVENOUS
  Administered 2015-11-21: 1000 mL via INTRAVENOUS

## 2015-11-19 MED ORDER — DEXTROSE 5 % IV SOLN
2.0000 g | Freq: Once | INTRAVENOUS | Status: DC
  Filled 2015-11-19: qty 2

## 2015-11-19 MED ORDER — CAMPHOR-MENTHOL 0.5-0.5 % EX LOTN
1.0000 "application " | TOPICAL_LOTION | Freq: Two times a day (BID) | CUTANEOUS | Status: DC
  Administered 2015-11-19 – 2015-11-23 (×8): 1 via TOPICAL
  Filled 2015-11-19: qty 222

## 2015-11-19 MED ORDER — LORATADINE 10 MG PO TABS
10.0000 mg | ORAL_TABLET | Freq: Every day | ORAL | Status: DC
Start: 2015-11-19 — End: 2015-11-24
  Administered 2015-11-20 – 2015-11-24 (×5): 10 mg via ORAL
  Filled 2015-11-19 (×5): qty 1

## 2015-11-19 MED ORDER — PANTOPRAZOLE SODIUM 40 MG PO TBEC
40.0000 mg | DELAYED_RELEASE_TABLET | Freq: Every day | ORAL | Status: DC
Start: 2015-11-19 — End: 2015-11-24
  Administered 2015-11-20 – 2015-11-24 (×4): 40 mg via ORAL
  Filled 2015-11-19 (×4): qty 1

## 2015-11-19 NOTE — Progress Notes (Signed)
ANTIBIOTIC CONSULT NOTE - INITIAL  Pharmacy Consult for Levaquin / Azactam Indication: UTI  Allergies  Allergen Reactions  . Penicillins     Patient Measurements: Height: 5\' 3"  (160 cm) Weight: 157 lb 12.8 oz (71.578 kg) IBW/kg (Calculated) : 52.4 Adjusted Body Weight:   Vital Signs: Temp: 98.3 F (36.8 C) (03/23 1501) Temp Source: Axillary (03/23 1501) BP: 118/65 mmHg (03/23 1501) Pulse Rate: 92 (03/23 1501) Intake/Output from previous day:   Intake/Output from this shift:    Labs:  Recent Labs  11/19/15 11/19/15 1116 11/19/15 1739 11/19/15 2045  WBC 15.8 15.8*  --   --   HGB  --  13.3  --   --   PLT  --  257  --   --   CREATININE 1.5* 1.52* 1.37* 1.41*   Estimated Creatinine Clearance: 27.2 mL/min (by C-G formula based on Cr of 1.41). No results for input(s): VANCOTROUGH, VANCOPEAK, VANCORANDOM, GENTTROUGH, GENTPEAK, GENTRANDOM, TOBRATROUGH, TOBRAPEAK, TOBRARND, AMIKACINPEAK, AMIKACINTROU, AMIKACIN in the last 72 hours.   Microbiology: No results found for this or any previous visit (from the past 720 hour(s)).  Medical History: History reviewed. No pertinent past medical history.  Medications:  Scheduled:  . aztreonam  1 g Intravenous 3 times per day  . camphor-menthol  1 application Topical BID  . carbamide peroxide  10 drop Both Ears BID  . fluocinonide cream  1 application Topical BID  . heparin  5,000 Units Subcutaneous 3 times per day  . insulin aspart  0-9 Units Subcutaneous 6 times per day  . [START ON 11/21/2015] levofloxacin (LEVAQUIN) IV  500 mg Intravenous Q48H  . levofloxacin (LEVAQUIN) IV  750 mg Intravenous Once  . loratadine  10 mg Oral Daily  . magnesium sulfate 1 - 4 g bolus IVPB  2 g Intravenous Once  . pantoprazole  40 mg Oral Daily  . potassium chloride  10 mEq Intravenous Q1 Hr x 3  . sulfaSALAzine  1,000 mg Oral TID   Infusions:  . dextrose     Assessment: 82 y.o. female with PMH of hypertension and advanced Alzheimer  dementia who presents as a direct admission from her SNF with loss of appetite, subsequent dehydration, and critical hypernatremia.  Pharmacy to dose Azactam and Levaquin for UTI CrCl 27.2 ml/min  Goal of Therapy:  Eradicate infection  Plan:  Azactam 1 GM IV every 8 hours Levaquin 750 mg IV x 1 dose, then 500 mg IV every 48 hours Monitor renal function Labs per protocol  Raquel JamesPittman, Hareem Surowiec Bennett 11/19/2015,9:48 PM

## 2015-11-19 NOTE — H&P (Signed)
[facility] History and Physical  Mikah Rottinghaus ZOX:[id number] DOB: [birth year] DOA: 11/19/2015  Referring physician: ED physician PCP: No primary care provider on file.  Specialists: None listed   Chief Complaint:  Poor appetite, dehydration   HPI: Theresa Ford is a 82 y.o. female with PMH of hypertension and advanced Alzheimer dementia who presents as a direct admission from her SNF with loss of appetite, subsequent dehydration, and critical hypernatremia. Patient was evaluated at the SNF with some basic blood work and noted to have a serum sodium of greater than 160. Radiographs of the chest and abdomen were also obtained and suggestive of fecal impaction. She was directly admitted to [facility] for ongoing evaluation and management of these problems. History is obtained through discussion with hospital personnel, chart review, and discussion with the patient's husband at the bedside. There's been no fevers noted and no vomiting or diarrhea. Patient is had a similar presentation with urinary tract infection in the past, but she does not have these frequently. There is been no rash or cough. The patient has remained alert, though confused.  She is evaluated on the general floor at [facility] And found to be afebrile, saturating well on room air, and with vital signs stable. Blood work from the SNF is reviewed and notable for a serum sodium of 163, serum chloride greater than 130, and serum creatinine of 1.52, up from an apparent baseline of 0.9. CBC features a leukocytosis to 15,800. She has been admitted to the hospital and will be managed for her dehydration, electrolyte derangements, and suspected infection.  Where does patient live?    SNF   Can patient participate in ADLs?  Yes       Review of Systems:  Unable to obtain ROS secondary to patient's clinical condition with advanced dementia.     Allergy:  Allergies  Allergen Reactions  . Penicillins     History reviewed. No  pertinent past medical history.  History reviewed. No pertinent past surgical history.  Social History:  reports that she has never smoked. She does not have any smokeless tobacco history on file. Her alcohol and drug histories are not on file.  Family History: History reviewed. No pertinent family history.   Prior to Admission medications   Medication Sig Start Date End Date Taking? Authorizing Provider  acetaminophen (TYLENOL) 325 MG tablet Take 650 mg by mouth every 4 (four) hours as needed.    Historical Provider, MD  bisacodyl (DULCOLAX) 5 MG EC tablet Take 5 mg by mouth daily as needed for moderate constipation.    Historical Provider, MD  camphor-menthol Wynelle Fanny) lotion Apply 1 application topically 2 (two) times daily.    Historical Provider, MD  carbamide peroxide (DEBROX) 6.5 % otic solution Place 10 drops into both ears 2 (two) times daily.    Historical Provider, MD  CREAM BASE EX Apply topically. Cultivate cream- apply cream to affected areas on groin, axilla as needed for itching    Historical Provider, MD  docusate sodium (COLACE) 100 MG capsule Take 100 mg by mouth 2 (two) times daily.    Historical Provider, MD  fluocinonide cream (LIDEX) 0.05 % Apply 1 application topically 2 (two) times daily. For itching    Historical Provider, MD  guaiFENesin-dextromethorphan (ROBITUSSIN DM) 100-10 MG/5ML syrup Take 5 mLs by mouth every 6 (six) hours as needed for cough.    Historical Provider, MD  ipratropium (ATROVENT) 0.02 % nebulizer solution Take 0.5 mg by nebulization every 6 (six) hours as  needed for wheezing or shortness of breath.    Historical Provider, MD  IPRATROPIUM BROMIDE IN 2.54ml inhalation every 6 hours as needed for wheezing, SOB, Congestion    Historical Provider, MD  loratadine (CLARITIN) 10 MG tablet Take 10 mg by mouth daily.    Historical Provider, MD  omeprazole (PRILOSEC) 20 MG capsule Take 20 mg by mouth daily.    Historical Provider, MD  polyethylene glycol  (MIRALAX / GLYCOLAX) packet Take 17 g by mouth daily.    Historical Provider, MD  Propylene Glycol (SYSTANE BALANCE) 0.6 % SOLN Apply to eye 2 (two) times daily as needed (For dry eyes).    Historical Provider, MD  Sodium Phosphates (FLEET ENEMA RE) Place 133 mLs rectally as needed (For Constipation).    Historical Provider, MD  sulfaSALAzine (AZULFIDINE) 500 MG tablet Take 1,000 mg by mouth 3 (three) times daily.    Historical Provider, MD  traZODone (DESYREL) 50 MG tablet Take 50 mg by mouth at bedtime as needed for sleep.    Historical Provider, MD  Triclosan (CETAPHIL ANTIBACTERIAL) 0.3 % BAR Apply topically. Use soap with bathing    Historical Provider, MD    Physical Exam: Filed Vitals:   11/19/15 1445 11/19/15 1501  BP: 99/82 118/65  Pulse: 94 92  Temp: 98.2 F (36.8 C) 98.3 F (36.8 C)  TempSrc: Axillary Axillary  Resp: 18 18  Height:   (1.6 m)  Weight: 71.94 kg (158 lb 9.6 oz) 71.578 kg (157 lb 12.8 oz)  SpO2: 98% 94%   General: Not in acute distress HEENT:       Eyes: PERRL, EOMI, no scleral icterus or conjunctival pallor.       ENT: No discharge from the ears or nose, no pharyngeal ulcers, oral mucosa dry and tacky.        Neck: No JVD, no bruit, no appreciable mass Heme: No cervical adenopathy, no pallor Cardiac: S1/S2, RRR, soft systolic murmur throughout precordium, No gallops or rubs. Pulm: Good air movement bilaterally. No rales, wheezing, rhonchi or rubs. Abd: Soft, nondistended, nontender, no rebound pain or gaurding, BS present. Ext: No LE edema bilaterally. 2+DP/PT pulse bilaterally. Musculoskeletal: No gross deformity, no red, hot, swollen joints   Skin: No rashes or wounds on exposed surfaces  Neuro: Completely disoriented, no eye-contact, does not attempt to follow commands, PERRL, no gross facial asymmetry, moving all extremities, extraocular movements grossly intact, patellar DTR 2+ b/l, Babinski is down-going b/l.  Psych: Patient appears to attend to  internal stimuli; unable to answer questions.  Labs on Admission:  Basic Metabolic Panel:  Recent Labs Lab 11/19/15 11/19/15 1116  NA 163* 163*  K  --  3.5  CL  --  >130*  CO2  --  21*  GLUCOSE  --  163*  BUN 45* 45*  CREATININE 1.5* 1.52*  CALCIUM  --  9.6   Liver Function Tests:  Recent Labs Lab 11/19/15 1116  AST 21  ALT 14  ALKPHOS 88  BILITOT 0.7  PROT 7.7  ALBUMIN 3.7   No results for input(s): LIPASE, AMYLASE in the last 168 hours. No results for input(s): AMMONIA in the last 168 hours. CBC:  Recent Labs Lab 11/19/15 11/19/15 1116  WBC 15.8 15.8*  NEUTROABS  --  11.8*  HGB  --  13.3  HCT  --  43.7  MCV  --  100.0  PLT  --  257   Cardiac Enzymes: No results for input(s): CKTOTAL, CKMB, CKMBINDEX, TROPONINI in  the last 168 hours.  BNP (last 3 results) No results for input(s): BNP in the last 8760 hours.  ProBNP (last 3 results) No results for input(s): PROBNP in the last 8760 hours.  CBG: No results for input(s): GLUCAP in the last 168 hours.  Radiological Exams on Admission: Dg Chest 1 View  11/19/2015  CLINICAL DATA:  Failure to thrive EXAM: CHEST 1 VIEW COMPARISON:  None. FINDINGS: And AP sitting erect view of the chest shows no focal infiltrate or effusion. Mediastinal and hilar contours are unremarkable. The heart is within upper limits of normal. No bony abnormality is seen. IMPRESSION: No active disease. Electronically Signed   By: Dwyane DeePaul  Barry M.D.   On: 11/19/2015 11:05   Dg Abd 1 View  11/19/2015  CLINICAL DATA:  Failure to thrive EXAM: ABDOMEN - 1 VIEW COMPARISON:  None. FINDINGS: A supine film of the abdomen shows no bowel obstruction. There is a moderate to large amount of feces particularly in the rectosigmoid colon which may indicate fecal impaction. No opaque calculi are seen. The bones are osteopenic. IMPRESSION: Significant amount of feces within the rectum suspicious for fecal impaction. No bowel obstruction. Electronically Signed    By: Dwyane DeePaul  Barry M.D.   On: 11/19/2015 11:04    EKG: Not done in ED, will obtain as appropriate   Assessment/Plan  1. Dehydration with hypernatremia  - Suspected secondary to acute cystitis and very poor oral intake  - Replacing free water losses with D5W for now - Following BMP q4h overnight while on D5W  - Goal is to bring serum sodium down to 150-155 in 24 hrs, will adjust fluids prn  - Follow strict I/O   2. Fecal impaction  - Dehydration contributes to this  - Will need disimpaction, manual and with mineral oil enemas - Once disimpaction achieved, can transition to maintenance regimen    3. Acute kidney injury  - SCr 1.52 on admission, up from apparent baseline of 0.9  - Suspected secondary to dehydration  - Replacing fluids as above  - Following serial BMPs while correcting elecrolytes - If fails to resolve as expected with volume replacement, may need to extend workup   4. UTI  - UA is grossly positive, culture requested  - No prior positive cultures in EMR  - Will treat with Levaquin and aztreonam empirically given her penicillin allergy and residence at SNF  - Follow-up culture and tailor abx accordingly  - Follow lactate    5. Hypertension  - At goal currently  - Monitor without scheduled antihypertensives given her dehydration and acute infection    6. Hyperglycemia  - Presents with serum glucose 163; no hx of DM  - Hypernatremia is being addressed with D5W as above  - Will check CBG q4h while on D5W and use low-intensity SSI correctional prn  - If sugars difficult to control, fluids can be changed to 0.45% NaCl  7. Dementia  - Has been progressively worsening as expected per pt's husband  - Briefly touched of goals of care with pt's husband, but he prefers to wait for one of their sons to arrive     DVT ppx: SQ Heparin     Code Status: Full code Family Communication:  Yes, patient's husband at bed side Disposition Plan: Admit to inpatient   Date of  Service 11/19/2015    Briscoe Deutscherimothy S Opyd, MD [facility] Pager [phone]  If 7PM-7AM, please contact night-coverage [email or web address] Password [email or web address] 11/19/2015, 5:21 PM

## 2015-11-20 DIAGNOSIS — E43 Unspecified severe protein-calorie malnutrition: Secondary | ICD-10-CM | POA: Diagnosis present

## 2015-11-20 DIAGNOSIS — R627 Adult failure to thrive: Secondary | ICD-10-CM

## 2015-11-20 DIAGNOSIS — K5900 Constipation, unspecified: Secondary | ICD-10-CM | POA: Diagnosis present

## 2015-11-20 LAB — CBC WITH DIFFERENTIAL/PLATELET
BASOS ABS: 0 10*3/uL (ref 0.0–0.1)
Basophils Relative: 0 %
Eosinophils Absolute: 0.4 10*3/uL (ref 0.0–0.7)
Eosinophils Relative: 4 %
HEMATOCRIT: 33 % — AB (ref 36.0–46.0)
HEMOGLOBIN: 7.9 g/dL — AB (ref 12.0–15.0)
LYMPHS PCT: 22 %
Lymphs Abs: 2 10*3/uL (ref 0.7–4.0)
MCH: 23.8 pg — ABNORMAL LOW (ref 26.0–34.0)
MCHC: 23.9 g/dL — ABNORMAL LOW (ref 30.0–36.0)
MCV: 99.4 fL (ref 78.0–100.0)
MONO ABS: 0.5 10*3/uL (ref 0.1–1.0)
MONOS PCT: 6 %
Neutro Abs: 6.3 10*3/uL (ref 1.7–7.7)
Neutrophils Relative %: 69 %
Platelets: 117 10*3/uL — ABNORMAL LOW (ref 150–400)
RBC: 3.32 MIL/uL — ABNORMAL LOW (ref 3.87–5.11)
RDW: 16.1 % — AB (ref 11.5–15.5)
WBC: 9.2 10*3/uL (ref 4.0–10.5)

## 2015-11-20 LAB — BASIC METABOLIC PANEL
ANION GAP: 8 (ref 5–15)
ANION GAP: 9 (ref 5–15)
ANION GAP: 6 (ref 5–15)
BUN: 39 mg/dL — ABNORMAL HIGH (ref 6–20)
BUN: 37 mg/dL — AB (ref 6–20)
BUN: 35 mg/dL — AB (ref 6–20)
BUN: 27 mg/dL — ABNORMAL HIGH (ref 6–20)
CALCIUM: 8.6 mg/dL — AB (ref 8.9–10.3)
CHLORIDE: 129 mmol/L — AB (ref 101–111)
CHLORIDE: 129 mmol/L — AB (ref 101–111)
CHLORIDE: 119 mmol/L — AB (ref 101–111)
CO2: 25 mmol/L (ref 22–32)
CO2: 24 mmol/L (ref 22–32)
CO2: 20 mmol/L — ABNORMAL LOW (ref 22–32)
CO2: 22 mmol/L (ref 22–32)
Calcium: 9.3 mg/dL (ref 8.9–10.3)
Calcium: 9 mg/dL (ref 8.9–10.3)
Calcium: 8.9 mg/dL (ref 8.9–10.3)
Creatinine, Ser: 1.41 mg/dL — ABNORMAL HIGH (ref 0.44–1.00)
Creatinine, Ser: 1.3 mg/dL — ABNORMAL HIGH (ref 0.44–1.00)
Creatinine, Ser: 1.29 mg/dL — ABNORMAL HIGH (ref 0.44–1.00)
Creatinine, Ser: 1.19 mg/dL — ABNORMAL HIGH (ref 0.44–1.00)
GFR calc Af Amer: 38 mL/min — ABNORMAL LOW (ref >60)
GFR calc Af Amer: 42 mL/min — ABNORMAL LOW (ref >60)
GFR calc Af Amer: 47 mL/min — ABNORMAL LOW (ref >60)
GFR calc non Af Amer: 36 mL/min — ABNORMAL LOW (ref >60)
GFR calc non Af Amer: 36 mL/min — ABNORMAL LOW (ref >60)
GFR calc non Af Amer: 40 mL/min — ABNORMAL LOW (ref >60)
GFR, EST AFRICAN AMERICAN: 42 mL/min — AB (ref >60)
GFR, EST NON AFRICAN AMERICAN: 33 mL/min — AB (ref >60)
GLUCOSE: 131 mg/dL — AB (ref 65–99)
GLUCOSE: 106 mg/dL — AB (ref 65–99)
Glucose, Bld: 145 mg/dL — ABNORMAL HIGH (ref 65–99)
Glucose, Bld: 113 mg/dL — ABNORMAL HIGH (ref 65–99)
POTASSIUM: 3.9 mmol/L (ref 3.5–5.1)
POTASSIUM: 4 mmol/L (ref 3.5–5.1)
POTASSIUM: 4.4 mmol/L (ref 3.5–5.1)
Potassium: 3.7 mmol/L (ref 3.5–5.1)
SODIUM: 161 mmol/L — AB (ref 135–145)
SODIUM: 158 mmol/L — AB (ref 135–145)
Sodium: 165 mmol/L (ref 135–145)
Sodium: 147 mmol/L — ABNORMAL HIGH (ref 135–145)

## 2015-11-20 LAB — RETICULOCYTES
RBC.: 4.1 MIL/uL (ref 3.87–5.11)
Retic Count, Absolute: 77.9 10*3/uL (ref 19.0–186.0)
Retic Ct Pct: 1.9 % (ref 0.4–3.1)

## 2015-11-20 LAB — GLUCOSE, CAPILLARY
GLUCOSE-CAPILLARY: 134 mg/dL — AB (ref 65–99)
GLUCOSE-CAPILLARY: 132 mg/dL — AB (ref 65–99)
GLUCOSE-CAPILLARY: 101 mg/dL — AB (ref 65–99)
Glucose-Capillary: 119 mg/dL — ABNORMAL HIGH (ref 65–99)
Glucose-Capillary: 118 mg/dL — ABNORMAL HIGH (ref 65–99)

## 2015-11-20 LAB — IRON AND TIBC
IRON: 74 ug/dL (ref 28–170)
SATURATION RATIOS: 34 % — AB (ref 10.4–31.8)
TIBC: 220 ug/dL — AB (ref 250–450)
UIBC: 146 ug/dL

## 2015-11-20 LAB — FOLATE: FOLATE: 4.4 ng/mL — AB (ref >5.9)

## 2015-11-20 LAB — VITAMIN B12: Vitamin B-12: 265 pg/mL (ref 180–914)

## 2015-11-20 LAB — LACTIC ACID, PLASMA
Lactic Acid, Venous: 1 mmol/L (ref 0.5–2.0)
Lactic Acid, Venous: 1.3 mmol/L (ref 0.5–2.0)

## 2015-11-20 LAB — MAGNESIUM: MAGNESIUM: 3.3 mg/dL — AB (ref 1.7–2.4)

## 2015-11-20 LAB — FERRITIN: Ferritin: 60 ng/mL (ref 11–307)

## 2015-11-20 LAB — TROPONIN I

## 2015-11-20 MED ORDER — SORBITOL 70 % SOLN
960.0000 mL | TOPICAL_OIL | Freq: Once | ORAL | Status: DC
  Filled 2015-11-20: qty 240

## 2015-11-20 MED ORDER — MINERAL OIL RE ENEM
1.0000 | ENEMA | Freq: Once | RECTAL | Status: AC
  Administered 2015-11-20: 1 via RECTAL
  Filled 2015-11-20: qty 1

## 2015-11-20 MED ORDER — MUPIROCIN 2 % EX OINT
1.0000 "application " | TOPICAL_OINTMENT | Freq: Two times a day (BID) | CUTANEOUS | Status: DC
  Administered 2015-11-20 – 2015-11-24 (×7): 1 via NASAL
  Filled 2015-11-20 (×2): qty 22

## 2015-11-20 MED ORDER — CHLORHEXIDINE GLUCONATE CLOTH 2 % EX PADS
6.0000 | MEDICATED_PAD | Freq: Every day | CUTANEOUS | Status: DC
  Administered 2015-11-21 – 2015-11-24 (×4): 6 via TOPICAL

## 2015-11-20 NOTE — [place] FL2 (Signed)
[place] MEDICAID FL2 LEVEL OF CARE SCREENING TOOL     IDENTIFICATION  Patient Name: Theresa Ford Birthdate: 03-17-1927 Sex: female Admission Date (Current Location): 11/19/2015  [facility] and [place] Number:  [employer] and Address:  [facility],  [address], [place] [address]      Provider Number: [id number]  Attending Physician Name and Address:  Rodolph Bong, MD  Relative Name and Phone Number:       Current Level of Care: Hospital Recommended Level of Care: Nursing Facility (LTC) Prior Approval Number:    Date Approved/Denied:   PASRR Number:    Discharge Plan: SNF    Current Diagnoses: Patient Active Problem List   Diagnosis Date Noted  . Constipation 11/20/2015  . Hypernatremia 11/19/2015  . Dehydration 11/19/2015  . Fecal impaction (HCC) 11/19/2015  . Leukocytosis 11/19/2015  . AKI (acute kidney injury) (HCC) 11/19/2015  . Hyperglycemia 11/19/2015  . Dehydration with hypernatremia 11/19/2015  . FTT (failure to thrive) in adult 11/17/2015  . Cerumen debris on tympanic membrane 09/15/2015  . Cellulitis 08/12/2015  . Fever 08/07/2015  . Chest congestion 08/07/2015  . Acute bronchitis 12/25/2014  . Cough 12/16/2014  . Contusion 08/20/2014  . Conjunctivitis 04/01/2014  . Urinary frequency 06/13/2013  . Dermatitis 03/14/2013  . Dementia in Alzheimer's disease 02/05/2013  . Urinary output arrest of 02/05/2013  . Dementia with Lewy bodies 11/30/2012  . HTN (hypertension) 11/30/2012  . GERD (gastroesophageal reflux disease) 11/30/2012  . Anemia 11/30/2012  . Ulcerative colitis (HCC) 11/30/2012    Orientation RESPIRATION BLADDER Height & Weight     Self  Normal Incontinent Weight: 157 lb 4.8 oz (71.351 kg) Height:   (160 cm)  BEHAVIORAL SYMPTOMS/MOOD NEUROLOGICAL BOWEL NUTRITION STATUS  Other (Comment) (none)  (confused) Incontinent Diet (regular)  AMBULATORY STATUS COMMUNICATION OF NEEDS Skin   Extensive Assist  Verbally Normal                       Personal Care Assistance Level of Assistance  Bathing, Dressing, Feeding Bathing Assistance: Limited assistance Feeding assistance: Limited assistance Dressing Assistance: Limited assistance     Functional Limitations Info  Sight, Hearing, Speech Sight Info: Impaired Hearing Info: Impaired Speech Info: Impaired    SPECIAL CARE FACTORS FREQUENCY                       Contractures Contractures Info: Not present    Additional Factors Info  Code Status, Allergies, Psychotropic, Insulin Sliding Scale, Isolation Precautions Code Status Info: Full Allergies Info: Penicillins Psychotropic Info: none noted Insulin Sliding Scale Info: 0-9 units (6x day) Isolation Precautions Info: on contact percautions: MRSA     Current Medications (11/20/2015):  This is the current hospital active medication list Current Facility-Administered Medications  Medication Dose Route Frequency Provider Last Rate Last Dose  . acetaminophen (TYLENOL) tablet 650 mg  650 mg Oral Q4H PRN Briscoe Deutscher, MD      . aztreonam (AZACTAM) 1 g in dextrose 5 % 50 mL IVPB  1 g Intravenous 3 times per day Briscoe Deutscher, MD   1 g at 11/20/15 0515  . camphor-menthol (SARNA) lotion 1 application  1 application Topical BID Briscoe Deutscher, MD   1 application at 11/19/15 2317  . carbamide peroxide (DEBROX) 6.5 % otic solution 10 drop  10 drop Both Ears BID Briscoe Deutscher, MD   10 drop at 11/19/15 2200  .  dextrose 5 % solution   Intravenous Continuous Leda GauzeKaren J Kirby-Graham, NP 125 mL/hr at 11/19/15 2158    . fluocinonide cream (LIDEX) 0.05 % 1 application  1 application Topical BID Briscoe Deutscherimothy S Opyd, MD   1 application at 11/19/15 2317  . guaiFENesin-dextromethorphan (ROBITUSSIN DM) 100-10 MG/5ML syrup 5 mL  5 mL Oral Q6H PRN Lavone Neriimothy S Opyd, MD      . heparin injection 5,000 Units  5,000 Units Subcutaneous 3 times per day Briscoe Deutscherimothy S Opyd, MD   5,000 Units at 11/20/15 0516  . insulin  aspart (novoLOG) injection 0-9 Units  0-9 Units Subcutaneous 6 times per day Briscoe Deutscherimothy S Opyd, MD   0 Units at 11/19/15 1745  . ipratropium (ATROVENT) nebulizer solution 0.5 mg  0.5 mg Nebulization Q6H PRN Briscoe Deutscherimothy S Opyd, MD      . Melene Muller[START ON 11/21/2015] levofloxacin (LEVAQUIN) IVPB 500 mg  500 mg Intravenous Q48H Timothy S Opyd, MD      . loratadine (CLARITIN) tablet 10 mg  10 mg Oral Daily Briscoe Deutscherimothy S Opyd, MD   10 mg at 11/19/15 1730  . LORazepam (ATIVAN) injection 0.5 mg  0.5 mg Intravenous Q4H PRN Briscoe Deutscherimothy S Opyd, MD   0.5 mg at 11/20/15 0448  . mineral oil enema 1 enema  1 enema Rectal Once Rodolph Bonganiel [place] V, MD      . ondansetron [facility](ZOFRAN) tablet 4 mg  4 mg Oral Q6H PRN Briscoe Deutscherimothy S Opyd, MD       Or  . ondansetron (ZOFRAN) injection 4 mg  4 mg Intravenous Q6H PRN Briscoe Deutscherimothy S Opyd, MD      . pantoprazole (PROTONIX) EC tablet 40 mg  40 mg Oral Daily Briscoe Deutscherimothy S Opyd, MD   40 mg at 11/19/15 1730  . polyvinyl alcohol (LIQUIFILM TEARS) 1.4 % ophthalmic solution 1 drop  1 drop Both Eyes BID PRN Briscoe Deutscherimothy S Opyd, MD      . sulfaSALAzine (AZULFIDINE) tablet 1,000 mg  1,000 mg Oral TID Briscoe Deutscherimothy S Opyd, MD   1,000 mg at 11/19/15 1730  . traZODone (DESYREL) tablet 50 mg  50 mg Oral QHS PRN Briscoe Deutscherimothy S Opyd, MD         Discharge Medications: Please see discharge summary for a list of discharge medications.  Relevant Imaging Results:  Relevant Lab Results:   Additional Information    Raye SorrowCoble, Seaborn Nakama N, LCSW

## 2015-11-20 NOTE — Progress Notes (Signed)
Initial Nutrition Assessment  DOCUMENTATION CODES:   Severe malnutrition in context of acute illness/injury  INTERVENTION:  Magic cup (Chocolate)TID with meals, each supplement provides 290 kcal and 9 grams of protein   Boost po TID (chcolate), each supplement provides 250 kcal and 14 grams of protein  Assist with feeding  NUTRITION DIAGNOSIS:   Malnutrition related to acute illness as evidenced by percent weight loss, energy intake < or equal to 50% for > or equal to 5 days.   GOAL:   Patient will meet greater than or equal to 90% of their needs   MONITOR:   PO intake, Supplement acceptance, Labs, Weight trends  REASON FOR ASSESSMENT:   Malnutrition Screening Tool    ASSESSMENT:   82 y.o. female with PMH of hypertension and advanced Alzheimer dementia who presents as a direct admission from her SNF with loss of appetite, subsequent dehydration, and critical hypernatremia. Patient was evaluated at the SNF with some basic blood work and noted to have a serum sodium of greater than 160. Radiographs of the chest and abdomen were also obtained and suggestive of fecal impaction.  Pt decreased po intake over the past 10 days. Supplements were added due to her diminished appetite.  Pt is dependent with feeding. Staff recently reporting pt refusing to eat which is a change in her baseline. She normally has been eating well and has maintained stable intake and weight for >1 yr.   She has been on a Pureed diet since 04/30/2013.   Significant weight loss (4.6%) in 21 days and 8.3% in 110 days associated with poor po intake food and fluids- as noted above dehydrated on admission.   Abnormal labs: sodium 158 (high), BUN-35, Cr 1.29 (elevated), GFR-36. H/H- 7.9/33.0  Diet Order:  DIET - DYS 1 Room service appropriate?: Yes; Fluid consistency:: Thin  Skin:   intact  Last BM:   3/23 - per MD pt to be started on a bowel regimen once constipation has cleared   Height:   Ht Readings  from Last 1 Encounters:  11/19/15 5\' 3"  (1.6 m)    Weight:   Wt Readings from Last 1 Encounters:  11/20/15 157 lb 4.8 oz (71.351 kg)    Ideal Body Weight:  52.2 kg  BMI:  Body mass index is 27.87 kg/(m^2).  Estimated Nutritional Needs:   Kcal:  1600-1800  Protein:  80-88 gr   Fluid:  1.8-2.0 liters  EDUCATION NEEDS:   No education needs identified at this time  Royann ShiversLynn Alton Tremblay MS,RD,CSG,LDN Office: #[phone] Pager: [phone]

## 2015-11-20 NOTE — Care Management Important Message (Signed)
Important Message  Patient Details  Name: Theresa BarreBettie Landenberger MRN: [id number] Date of Birth: 07-Jun-1927   Medicare Important Message Given:  Yes    Malcolm MetroChildress, Ronelle Smallman Demske, RN 11/20/2015, 10:13 AM

## 2015-11-20 NOTE — Progress Notes (Signed)
[facility] PROGRESS NOTE  Theresa Ford VWU:[id number] DOB: February 19, 1927 DOA: 11/19/2015 PCP: No primary care provider on file.  Assessment/Plan: #1 dehydration and hypernatremia Likely secondary to poor oral intake and in the setting of urinary tract infection. Urine cultures pending. Patient is moaning. Sodium levels trending back down and sodium 158 from 165 last night from 163 on admission. Continue D5W. Continue serial BMETS. Follow.  #2 urinary tract infection urine cultures pending. Continue empiric IV Levaquin and IV azactam.  #3 acute kidney injury Creatinine on admission was 1.52. Baseline apparently 0.9. Likely secondary to a prerenal azotemia secondary to poor oral intake and dehydration. Renal function trending down. Continue hydration. Follow.  #4 fecal impaction/constipation Dehydration contributing to fecal impaction. Patient was given the mineral oil enema last night with no significant results. Manual disimpaction. We'll give another mineral oil enema. Once disimpaction has resolved will place on a bowel regimen.  #5 hypertension Stable.  #6 Alzheimer's dementia/FTT Has been progressively worsening. Reassess once electrolyte abnormalities have been resolved. If continued worsening may need a palliative care consultation.  #7 hyperglycemia Monitor with D5W. Sliding scale insulin.  #8 leukocytosis Likely secondary to UTI. Urine cultures pending. Continue empiric IV Levaquin and IV Azactam.  #9 prophylaxis PPI for GI prophylaxis. Heparin for DVT prophylaxis.   Code Status: Full Family Communication: Updated husband at bedside. Disposition Plan: Back to skilled nursing facility once hypernatremia has resolved, urine cultures have resulted in patient on oral antibiotics, renal function has improved and medically stable.   Consultants:  None  Procedures:  Chest x-ray 11/19/2015  Abdominal x-rays 11/19/2015  Antibiotics:  IV Azactam 11/19/2015  IV  Levaquin 11/19/2015  HPI/Subjective: Patient is moaning. Patient refuses to open eyes. Husband at bedside.  Objective: Filed Vitals:   11/19/15 1501 11/20/15 0500  BP: 118/65 125/76  Pulse: 92 106  Temp: 98.3 F (36.8 C) 98.4 F (36.9 C)  Resp: 18    No intake or output data in the 24 hours ending 11/20/15 1017 Filed Weights   11/19/15 1445 11/19/15 1501 11/20/15 0534  Weight: 71.94 kg (158 lb 9.6 oz) 71.578 kg (157 lb 12.8 oz) 71.351 kg (157 lb 4.8 oz)    Exam:   General:  NAD  Cardiovascular: Tachycardia  Respiratory: CTAB anterior lung fields  Abdomen: Soft, nontender, nondistended, positive bowel sounds.  Musculoskeletal: No clubbing cyanosis or edema.  Data Reviewed: Basic Metabolic Panel:  Recent Labs Lab 11/19/15 1116 11/19/15 1739 11/19/15 2045 11/20/15 0100 11/20/15 0515  NA 163* 160* 165* 161* 158*  K 3.5 3.3* 3.9 4.0 4.4  CL >130* 128* >130* 129* 129*  CO2 21* 20*  GLUCOSE 163* 103* 131* 145* 113*  BUN 45* 42* 39* 37* 35*  CREATININE 1.52* 1.37* 1.41* 1.30* 1.29*  CALCIUM 9.6 8.9 9.3 9.0 8.9  MG  --  2.9*  --   --  3.3*   Liver Function Tests:  Recent Labs Lab 11/19/15 1116  AST 21  ALT 14  ALKPHOS 88  BILITOT 0.7  PROT 7.7  ALBUMIN 3.7   No results for input(s): LIPASE, AMYLASE in the last 168 hours. No results for input(s): AMMONIA in the last 168 hours. CBC:  Recent Labs Lab 11/19/15 11/19/15 1116  WBC 15.8 15.8*  NEUTROABS  --  11.8*  HGB  --  13.3  HCT  --  43.7  MCV  --  100.0  PLT  --  257   Cardiac Enzymes:  Recent Labs Lab 11/19/15 1739 11/19/15  2307 11/20/15 0515  TROPONINI <0.03 <0.03 <0.03   BNP (last 3 results) No results for input(s): BNP in the last 8760 hours.  ProBNP (last 3 results) No results for input(s): PROBNP in the last 8760 hours.  CBG:  Recent Labs Lab 11/19/15 2114 11/20/15 0127 11/20/15 0324 11/20/15 0429 11/20/15 0821  GLUCAP 115* 134* 132* 119* 101*    Recent  Results (from the past 240 hour(s))  MRSA PCR Screening     Status: Abnormal   Collection Time: 11/19/15  4:30 PM  Result Value Ref Range Status   MRSA by PCR POSITIVE (A) NEGATIVE Final    Comment:        The GeneXpert MRSA Assay (FDA approved for NASAL specimens only), is one component of a comprehensive MRSA colonization surveillance program. It is not intended to diagnose MRSA infection nor to guide or monitor treatment for MRSA infections. RESULT CALLED TO, READ BACK BY AND VERIFIED WITH: BIVENS,L ON 11/19/15 AT 2210 BY LOY,C      Studies: Dg Chest 1 View  11/19/2015  CLINICAL DATA:  Failure to thrive EXAM: CHEST 1 VIEW COMPARISON:  None. FINDINGS: And AP sitting erect view of the chest shows no focal infiltrate or effusion. Mediastinal and hilar contours are unremarkable. The heart is within upper limits of normal. No bony abnormality is seen. IMPRESSION: No active disease. Electronically Signed   By: Dwyane DeePaul  Barry M.D.   On: 11/19/2015 11:05   Dg Abd 1 View  11/19/2015  CLINICAL DATA:  Failure to thrive EXAM: ABDOMEN - 1 VIEW COMPARISON:  None. FINDINGS: A supine film of the abdomen shows no bowel obstruction. There is a moderate to large amount of feces particularly in the rectosigmoid colon which may indicate fecal impaction. No opaque calculi are seen. The bones are osteopenic. IMPRESSION: Significant amount of feces within the rectum suspicious for fecal impaction. No bowel obstruction. Electronically Signed   By: Dwyane DeePaul  Barry M.D.   On: 11/19/2015 11:04    Scheduled Meds: . aztreonam  1 g Intravenous 3 times per day  . camphor-menthol  1 application Topical BID  . carbamide peroxide  10 drop Both Ears BID  . fluocinonide cream  1 application Topical BID  . heparin  5,000 Units Subcutaneous 3 times per day  . insulin aspart  0-9 Units Subcutaneous 6 times per day  . [START ON 11/21/2015] levofloxacin (LEVAQUIN) IV  500 mg Intravenous Q48H  . loratadine  10 mg Oral Daily  .  mineral oil  1 enema Rectal Once  . pantoprazole  40 mg Oral Daily  . sulfaSALAzine  1,000 mg Oral TID   Continuous Infusions: . dextrose 125 mL/hr at 11/19/15 2158    Principal Problem:   Dehydration with hypernatremia Active Problems:   HTN (hypertension)   Dementia in Alzheimer's disease   FTT (failure to thrive) in adult   Hypernatremia   Dehydration   Fecal impaction (HCC)   Leukocytosis   AKI (acute kidney injury) (HCC)   Hyperglycemia   Constipation    Time spent: 40 mins    [facility] East Community HospitalHOMPSON,Amzie Sillas MD [facility] Pager [phone]. If 7PM-7AM, please contact night-coverage at [email or web address], password [email or web address] 11/20/2015, 10:17 AM  LOS: 1 day

## 2015-11-20 NOTE — Progress Notes (Signed)
SLP Cancellation Note  Patient Details Name: Theresa Ford MRN: [id number] DOB: 05-25-1927   Cancelled treatment:       Reason Eval/Treat Not Completed: Patient resting soundly in the bed and daughter in law reports patient has been very agitated "every time she is awakened" and has not been accepting much PO at all the past few days. She requested SLP not awaken/arouse the patient at this time. SLP will re-attempt BSE when patient is alert as schedule permits.   Reeya Bound H. Romie LeveeYarbrough MA, CCC-SLP Speech Language Pathologist   Georgetta Habermelia H [place] Sarinana 11/20/2015, 1:31 PM

## 2015-11-20 NOTE — Progress Notes (Signed)
Downgraded diet to dysphagia 1. Patient on Puree on SNF.   Nathan Franks Christophe LouisD, LDN Nutrition Pager: [phone] 11/20/2015 8:05 AM

## 2015-11-20 NOTE — Care Management Note (Signed)
Case Management Note  Patient Details  Name: Theresa BarreBettie Ford MRN: [id number] Date of Birth: May 18, 1927  Subjective/Objective:                  Pt admitted for UTI and FTT. [facility] Pt/family plans to return to facility at DC.   Action/Plan: CSW is aware of DC plan and will make arrangement for return to facility when appropriate. No CM needs anticipated.   Expected Discharge Date:    3/34/2017              Expected Discharge Plan:  Skilled Nursing Facility  In-House Referral:  Clinical Social Work  Discharge planning Services  CM Consult  Post Acute Care Choice:  NA Choice offered to:  NA  DME Arranged:    DME Agency:     HH Arranged:    HH Agency:     Status of Service:  Completed, signed off  Medicare Important Message Given:  Yes Date Medicare IM Given:    Medicare IM give by:    Date Additional Medicare IM Given:    Additional Medicare Important Message give by:     If discussed at Long Length of Stay Meetings, dates discussed:    Additional Comments:  Malcolm MetroChildress, Amanii Snethen Demske, RN 11/20/2015, 10:19 AM

## 2015-11-20 NOTE — Plan of Care (Signed)
Na up to 165 from admit 160. Spoke to Dr. Antionette Charpyd about lab. Will increase D5W to 125/hr and continue to cycle BMPs. KJKG, NP

## 2015-11-20 NOTE — Progress Notes (Signed)
CRITICAL VALUE ALERT  Critical value received:  165  Date of notification:  11/19/15  Time of notification:  2130  Critical value read back:yes  Nurse who received alert:  Grant RutsLauren Shandi Godfrey  MD notified (1st page):  Dr. Craige CottaKirby   Time of first page:  2135  Responding MD:  Dr. Craige CottaKirby  Time MD responded:  2142  MD ordered D5 fluids to be increase to 125/hour

## 2015-11-20 NOTE — Clinical Social Work Note (Signed)
Clinical Social Work Assessment  Patient Details  Name: Theresa Ford MRN: [id number] Date of Birth: [birth year]  Date of referral:  11/20/15               Reason for consult:  Other (Comment Required) (patient is [facility])                Permission sought to share information with:  Case Manager, Oceanographeracility Contact Representative Permission granted to share information::     Name::        Agency::  [facility]  Relationship::     Contact Information:     Housing/Transportation Living arrangements for the past 2 months:  Skilled Building surveyorursing Facility Source of Information:  Patient, Development worker, communityMedical Team, Facility Patient Interpreter Needed:  None Criminal Activity/Legal Involvement Pertinent to Current Situation/Hospitalization:  No - Comment as needed Significant Relationships:  Other Family Members, Phel[employer] DodgeCommunity Support Lives with:  Facility Resident Do you feel safe going back to the place where you live?  Yes Need for family participation in patient care:  Yes (Comment) (advanced memory impairment)  Care giving concerns:  No concerns at this time.  Facility reports she is a LTC patient and can return.  No other needs identified.    Social Worker assessment / plan:  LCSW attempted to meet with patient however due to cognitive function, unable to obtain information or assess. [facility] is a [facility] reports no barriers for return. [facility]  Employment status:  Retired Health and safety inspectornsurance information:  Medicare PT Recommendations:  Not assessed at this time Information / Referral to community resources:  Other (Comment Required) (none needed at this time)  Patient/Family's Response to care:  None to assess however facility was in agreement  Patient/Family's Understanding of and Emotional Response to Diagnosis, Current Treatment, and Prognosis:  NA due to Advanced Dementia  Emotional Assessment Appearance:   Appears stated age Attitude/Demeanor/Rapport:  Uncooperative, Unable to Assess (patient is confused,) Affect (typically observed):  Restless Orientation:  Oriented to Self Alcohol / Substance use:  Not Applicable Psych involvement (Current and /or in the community):  No (Comment)  Discharge Needs  Concerns to be addressed:  No discharge needs identified Readmission within the last 30 days:  No Current discharge risk:  None Barriers to Discharge:  No Barriers Identified   Theresa Ford, Theresa Pallas N, LCSW 11/20/2015, 11:01 AM

## 2015-11-21 ENCOUNTER — Inpatient Hospital Stay (HOSPITAL_COMMUNITY): Payer: Medicare Other

## 2015-11-21 LAB — CBC WITH DIFFERENTIAL/PLATELET
BASOS PCT: 0 %
Basophils Absolute: 0 10*3/uL (ref 0.0–0.1)
EOS PCT: 3 %
Eosinophils Absolute: 0.4 10*3/uL (ref 0.0–0.7)
HEMATOCRIT: 39 % (ref 36.0–46.0)
Hemoglobin: 11.8 g/dL — ABNORMAL LOW (ref 12.0–15.0)
LYMPHS ABS: 2.8 10*3/uL (ref 0.7–4.0)
Lymphocytes Relative: 23 %
MCH: 29.4 pg (ref 26.0–34.0)
MCHC: 30.3 g/dL (ref 30.0–36.0)
MCV: 97.3 fL (ref 78.0–100.0)
MONO ABS: 0.9 10*3/uL (ref 0.1–1.0)
MONOS PCT: 7 %
NEUTROS PCT: 67 %
Neutro Abs: 8.2 10*3/uL — ABNORMAL HIGH (ref 1.7–7.7)
PLATELETS: 229 10*3/uL (ref 150–400)
RBC: 4.01 MIL/uL (ref 3.87–5.11)
RDW: 15.4 % (ref 11.5–15.5)
WBC: 12.3 10*3/uL — AB (ref 4.0–10.5)

## 2015-11-21 LAB — BASIC METABOLIC PANEL
Anion gap: 8 (ref 5–15)
BUN: 22 mg/dL — ABNORMAL HIGH (ref 6–20)
CHLORIDE: 116 mmol/L — AB (ref 101–111)
CO2: 24 mmol/L (ref 22–32)
CREATININE: 1.14 mg/dL — AB (ref 0.44–1.00)
Calcium: 8.9 mg/dL (ref 8.9–10.3)
GFR, EST AFRICAN AMERICAN: 49 mL/min — AB (ref >60)
GFR, EST NON AFRICAN AMERICAN: 42 mL/min — AB (ref >60)
Glucose, Bld: 125 mg/dL — ABNORMAL HIGH (ref 65–99)
POTASSIUM: 3.5 mmol/L (ref 3.5–5.1)
SODIUM: 148 mmol/L — AB (ref 135–145)

## 2015-11-21 MED ORDER — POLYETHYLENE GLYCOL 3350 17 G PO PACK
17.0000 g | PACK | Freq: Every day | ORAL | Status: DC
  Administered 2015-11-22 – 2015-11-24 (×3): 17 g via ORAL
  Filled 2015-11-21 (×3): qty 1

## 2015-11-21 MED ORDER — FOLIC ACID 1 MG PO TABS
1.0000 mg | ORAL_TABLET | Freq: Every day | ORAL | Status: DC
  Administered 2015-11-21 – 2015-11-24 (×4): 1 mg via ORAL
  Filled 2015-11-21 (×4): qty 1

## 2015-11-21 MED ORDER — SENNOSIDES-DOCUSATE SODIUM 8.6-50 MG PO TABS
1.0000 | ORAL_TABLET | Freq: Every day | ORAL | Status: DC
  Administered 2015-11-21 – 2015-11-24 (×2): 1 via ORAL
  Filled 2015-11-21 (×2): qty 1

## 2015-11-21 NOTE — Progress Notes (Signed)
[facility] PROGRESS NOTE  Theresa Ford JYN:[id number] DOB: [birth year] DOA: 11/19/2015 PCP: No primary care provider on file.  Assessment/Plan: #1 dehydration and hypernatremia Likely secondary to poor oral intake and in the setting of urinary tract infection. Urine cultures pending. Patient more alert and following some commands. Per family significant improvement since admission. Sodium levels trending back down and sodium at 148 from 158 from 165 from 163 on admission. Decreased D5W 1200 mL per hour. Continue serial BMETS. Follow.  #2 urinary tract infection urine cultures pending. Continue empiric IV Levaquin. D/C IV azactam.  #3 acute kidney injury Creatinine on admission was 1.52. Baseline apparently 0.9. Likely secondary to a prerenal azotemia secondary to poor oral intake and dehydration. Renal function trending down. Decrease IV fluid rate. Follow.  #4 fecal impaction/constipation Dehydration contributing to fecal impaction. Patient was given the mineral oil enema with no significant results. Patient was unable to be manually disimpacted per nursing. Patient given smog enema with good results. Will place on a bowel regimen of MiraLAX daily and Senokot-S daily at bedtime.  #5 hypertension Stable.  #6 Alzheimer's dementia/FTT/Protein calorie malnuitrition Has been progressively worsening. Patient seems more alert today with improvement in her hypernatremia. Patient tolerating some of her dysphagia 1 diet. SLP pending.  #7 hyperglycemia Monitor with D5W. Sliding scale insulin.  #8 leukocytosis Likely secondary to UTI. Urine cultures pending. Continue empiric IV Levaquin and IV Azactam.  #9 prophylaxis PPI for GI prophylaxis. Heparin for DVT prophylaxis.   Code Status: Full Family Communication: Updated husband and sons at bedside. Disposition Plan: Back to skilled nursing facility once hypernatremia has resolved, urine cultures have resulted in patient on oral  antibiotics, renal function has improved and medically stable.   Consultants:  None  Procedures:  Chest x-ray 11/19/2015  Abdominal x-rays 11/19/2015  Antibiotics:  IV Azactam 11/19/2015>>>>11/21/2015  IV Levaquin 11/19/2015  HPI/Subjective: Patient alert and following some commands. Patient pleasantly confused. Per nursing patient with multiple moderate sized stools. Per family IV site infiltrated this morning. Family at bedside.  Objective: Filed Vitals:   11/20/15 2033 11/21/15 0519  BP: 159/75 140/66  Pulse: 87 72  Temp: 99.5 F (37.5 C) 98.6 F (37 C)  Resp: 20 20   No intake or output data in the 24 hours ending 11/21/15 1011 Filed Weights   11/19/15 1501 11/20/15 0534 11/21/15 0500  Weight: 71.578 kg (157 lb 12.8 oz) 71.351 kg (157 lb 4.8 oz) 72 kg (158 lb 11.7 oz)    Exam:   General:  NAD  Cardiovascular: RRR  Respiratory: CTAB  Abdomen: Soft, nontender, nondistended, positive bowel sounds.  Musculoskeletal: No clubbing cyanosis or edema.  Data Reviewed: Basic Metabolic Panel:  Recent Labs Lab 11/19/15 1739 11/19/15 2045 11/20/15 0100 11/20/15 0515 11/20/15 1725 11/21/15 0554  NA 160* 165* 161* 158* 147* 148*  K 3.3* 3.9 4.0 4.4 3.7 3.5  CL 128* >130* 129* 129* 119* 116*  CO2 25 25 24  20* 22 24  GLUCOSE 103* 131* 145* 113* 106* 125*  BUN 42* 39* 37* 35* 27* 22*  CREATININE 1.37* 1.41* 1.30* 1.29* 1.19* 1.14*  CALCIUM 8.9 9.3 9.0 8.9 8.6* 8.9  MG 2.9*  --   --  3.3*  --   --    Liver Function Tests:  Recent Labs Lab 11/19/15 1116  AST 21  ALT 14  ALKPHOS 88  BILITOT 0.7  PROT 7.7  ALBUMIN 3.7   No results for input(s): LIPASE, AMYLASE in the last 168 hours. No results  for input(s): AMMONIA in the last 168 hours. CBC:  Recent Labs Lab 11/19/15 11/19/15 1116 11/20/15 1005 11/21/15 0554  WBC 15.8 15.8* 9.2 12.3*  NEUTROABS  --  11.8* 6.3 8.2*  HGB  --  13.3 7.9* 11.8*  HCT  --  43.7 33.0* 39.0  MCV  --  100.0 99.4  97.3  PLT  --  257 117* 229   Cardiac Enzymes:  Recent Labs Lab 11/19/15 1739 11/19/15 2307 11/20/15 0515  TROPONINI <0.03 <0.03 <0.03   BNP (last 3 results) No results for input(s): BNP in the last 8760 hours.  ProBNP (last 3 results) No results for input(s): PROBNP in the last 8760 hours.  CBG:  Recent Labs Lab 11/20/15 0127 11/20/15 0324 11/20/15 0429 11/20/15 0821 11/20/15 1225  GLUCAP 134* 132* 119* 101* 118*    Recent Results (from the past 240 hour(s))  MRSA PCR Screening     Status: Abnormal   Collection Time: 11/19/15  4:30 PM  Result Value Ref Range Status   MRSA by PCR POSITIVE (A) NEGATIVE Final    Comment:        The GeneXpert MRSA Assay (FDA approved for NASAL specimens only), is one component of a comprehensive MRSA colonization surveillance program. It is not intended to diagnose MRSA infection nor to guide or monitor treatment for MRSA infections. RESULT CALLED TO, READ BACK BY AND VERIFIED WITH: BIVENS,L ON 11/19/15 AT 2210 BY LOY,C      Studies: Dg Chest 1 View  11/19/2015  CLINICAL DATA:  Failure to thrive EXAM: CHEST 1 VIEW COMPARISON:  None. FINDINGS: And AP sitting erect view of the chest shows no focal infiltrate or effusion. Mediastinal and hilar contours are unremarkable. The heart is within upper limits of normal. No bony abnormality is seen. IMPRESSION: No active disease. Electronically Signed   By: Dwyane Dee M.D.   On: 11/19/2015 11:05   Dg Abd 1 View  11/19/2015  CLINICAL DATA:  Failure to thrive EXAM: ABDOMEN - 1 VIEW COMPARISON:  None. FINDINGS: A supine film of the abdomen shows no bowel obstruction. There is a moderate to large amount of feces particularly in the rectosigmoid colon which may indicate fecal impaction. No opaque calculi are seen. The bones are osteopenic. IMPRESSION: Significant amount of feces within the rectum suspicious for fecal impaction. No bowel obstruction. Electronically Signed   By: Dwyane Dee M.D.    On: 11/19/2015 11:04    Scheduled Meds: . aztreonam  1 g Intravenous 3 times per day  . camphor-menthol  1 application Topical BID  . carbamide peroxide  10 drop Both Ears BID  . Chlorhexidine Gluconate Cloth  6 each Topical Q0600  . fluocinonide cream  1 application Topical BID  . heparin  5,000 Units Subcutaneous 3 times per day  . insulin aspart  0-9 Units Subcutaneous 6 times per day  . levofloxacin (LEVAQUIN) IV  500 mg Intravenous Q48H  . loratadine  10 mg Oral Daily  . mupirocin ointment  1 application Nasal BID  . pantoprazole  40 mg Oral Daily  . sorbitol, milk of mag, mineral oil, glycerin (SMOG) enema  960 mL Rectal Once  . sulfaSALAzine  1,000 mg Oral TID   Continuous Infusions: . dextrose 1,000 mL (11/21/15 0049)    Principal Problem:   Dehydration with hypernatremia Active Problems:   HTN (hypertension)   Dementia in Alzheimer's disease   FTT (failure to thrive) in adult   Hypernatremia   Dehydration   Fecal  impaction (HCC)   Leukocytosis   AKI (acute kidney injury) (HCC)   Hyperglycemia   Constipation   Protein-calorie malnutrition, severe    Time spent: 40 mins    Admire Bunnell MD [phone]. If 7PM-7AM, please contact night-coverage at [email or web address], password [facility] 11/21/2015, 10:11 AM  LOS: 2 days

## 2015-11-22 DIAGNOSIS — E43 Unspecified severe protein-calorie malnutrition: Secondary | ICD-10-CM

## 2015-11-22 LAB — BASIC METABOLIC PANEL
ANION GAP: 5 (ref 5–15)
BUN: 17 mg/dL (ref 6–20)
CO2: 21 mmol/L — AB (ref 22–32)
Calcium: 8.4 mg/dL — ABNORMAL LOW (ref 8.9–10.3)
Chloride: 116 mmol/L — ABNORMAL HIGH (ref 101–111)
Creatinine, Ser: 0.99 mg/dL (ref 0.44–1.00)
GFR calc Af Amer: 58 mL/min — ABNORMAL LOW (ref >60)
GFR calc non Af Amer: 50 mL/min — ABNORMAL LOW (ref >60)
GLUCOSE: 107 mg/dL — AB (ref 65–99)
POTASSIUM: 3.5 mmol/L (ref 3.5–5.1)
Sodium: 142 mmol/L (ref 135–145)

## 2015-11-22 LAB — CBC
HEMATOCRIT: 35 % — AB (ref 36.0–46.0)
Hemoglobin: 10.9 g/dL — ABNORMAL LOW (ref 12.0–15.0)
MCH: 29.6 pg (ref 26.0–34.0)
MCHC: 31.1 g/dL (ref 30.0–36.0)
MCV: 95.1 fL (ref 78.0–100.0)
PLATELETS: 203 10*3/uL (ref 150–400)
RBC: 3.68 MIL/uL — AB (ref 3.87–5.11)
RDW: 14.9 % (ref 11.5–15.5)
WBC: 7.4 10*3/uL (ref 4.0–10.5)

## 2015-11-22 LAB — URINE CULTURE: Culture: >=100000

## 2015-11-22 MED ORDER — SODIUM CHLORIDE 0.45 % IV SOLN
INTRAVENOUS | Status: DC
  Administered 2015-11-22 – 2015-11-23 (×2): via INTRAVENOUS
  Filled 2015-11-22 (×2): qty 1000

## 2015-11-22 NOTE — Progress Notes (Signed)
ANTIBIOTIC CONSULT NOTE   Pharmacy Consult for Levaquin  Indication: UTI  Allergies  Allergen Reactions  . Penicillins     Unknown reaction-Listed on MAR    Patient Measurements: Height: <BAD[facility] Center[facility]AG>5\' 3"  (160 cm) Weight: 159 lb 9.8 oz (72.4 kg) IBW/kg (Calculated) : 52.4  Vital Signs: Temp: 97.5 F (36.4 C) (03/26 0438) Temp Source: Axillary (03/26 0438) BP: 92/78 mmHg (03/26 0438) Pulse Rate: 79 (03/26 0438) Intake/Output from previous day: 03/25 0701 - 03/26 0700 In: 1780 [P.O.:480; I.V.:1200; IV Piggyback:100] Out: -  Intake/Output from this shift:    Labs:  Recent Labs  11/20/15 1005 11/20/15 1725 11/21/15 0554 11/22/15 0851  WBC 9.2  --  12.3* 7.4  HGB 7.9*  --  11.8* 10.9*  PLT 117*  --  229 203  CREATININE  --  1.19* 1.14* 0.99   Estimated Creatinine Clearance: 38.9 mL/min (by C-G formula based on Cr of 0.99). No results for input(s): VANCOTROUGH, VANCOPEAK, VANCORANDOM, GENTTROUGH, GENTPEAK, GENTRANDOM, TOBRATROUGH, TOBRAPEAK, TOBRARND, AMIKACINPEAK, AMIKACINTROU, AMIKACIN in the last 72 hours.   Microbiology: Recent Results (from the past 720 hour(s))  MRSA PCR Screening     Status: Abnormal   Collection Time: 11/19/15  4:30 PM  Result Value Ref Range Status   MRSA by PCR POSITIVE (A) NEGATIVE Final    Comment:        The GeneXpert MRSA Assay (FDA approved for NASAL specimens only), is one component of a comprehensive MRSA colonization surveillance program. It is not intended to diagnose MRSA infection nor to guide or monitor treatment for MRSA infections. RESULT CALLED TO, READ BACK BY AND VERIFIED WITH: BIVENS,L ON 11/19/15 AT 2210 BY LOY,C   Urine culture     Status: None (Preliminary result)   Collection Time: 11/19/15  7:00 PM  Result Value Ref Range Status   Specimen Description URINE, CATHETERIZED  Final   Special Requests NONE  Final   Culture   Final    >=100,000 COLONIES/mL GRAM NEGATIVE RODS [facility]    Report  Status PENDING  Incomplete   Medical History: History reviewed. No pertinent past medical history.  Medications:  Scheduled:  . camphor-menthol  1 application Topical BID  . carbamide peroxide  10 drop Both Ears BID  . Chlorhexidine Gluconate Cloth  6 each Topical Q0600  . fluocinonide cream  1 application Topical BID  . folic acid  1 mg Oral Daily  . heparin  5,000 Units Subcutaneous 3 times per day  . insulin aspart  0-9 Units Subcutaneous 6 times per day  . levofloxacin (LEVAQUIN) IV  500 mg Intravenous Q48H  . loratadine  10 mg Oral Daily  . mupirocin ointment  1 application Nasal BID  . pantoprazole  40 mg Oral Daily  . polyethylene glycol  17 g Oral Daily  . senna-docusate  1 tablet Oral QHS  . sorbitol, milk of mag, mineral oil, glycerin (SMOG) enema  960 mL Rectal Once  . sulfaSALAzine  1,000 mg Oral TID   Infusions:  . dextrose 100 mL/hr at 11/22/15 [id number]   Assessment: 82 y.o. female with PMH of hypertension and advanced Alzheimer dementia who presents as a direct admission from her SNF with loss of appetite, subsequent dehydration, and critical hypernatremia.  SCr has improved.  WBC improved.  Urine cx with GNR, awaiting ID Pharmacy consulted to dose Levaquin for UTI  Anti-infectives    Start     Dose/Rate Route Frequency Ordered Stop   11/21/15 2200  levofloxacin (LEVAQUIN) IVPB 500 mg     500 mg 100 mL/hr over 60 Minutes Intravenous Every 48 hours 11/19/15 2146     11/19/15 2200  aztreonam (AZACTAM) 1 g in dextrose 5 % 50 mL IVPB  Status:  Discontinued     1 g 100 mL/hr over 30 Minutes Intravenous 3 times per day 11/19/15 2145 11/21/15 1705   11/19/15 2130  levofloxacin (LEVAQUIN) IVPB 750 mg     750 mg 100 mL/hr over 90 Minutes Intravenous  Once 11/19/15 2124 11/20/15 0028   11/19/15 2130  aztreonam (AZACTAM) 2 g in dextrose 5 % 50 mL IVPB  Status:  Discontinued     2 g 100 mL/hr over 30 Minutes Intravenous  Once 11/19/15 2124 11/19/15 2144     Goal of  Therapy:  Eradicate infection  Plan:   Continue Levaquin 500 mg IV every 48 hours  F/U urine cx, speciation  Monitor labs, renal fxn, progress, LOT  [place], Charon Akamine A 11/22/2015,10:02 AM

## 2015-11-22 NOTE — Progress Notes (Signed)
Patient ID: Theresa Ford, female   DOB: 12/08/1926, 82 y.o.   MRN: [id number]           Date is 11/19/2015 this is an acute visit Level of care skilled.  Facility [facility].   Chief complaint --acute visit secondary to hypernatremia failure to thrive leukocytosis .  History of present illness.  Patient is a pleasant elderly resident with a history of significant dementia.  Nonetheless her stay here has been quite unremarkable and uneventful--s At times she will have conjunctivitis respiratory issues with these resolved fairly unremarkably. She also has a distant history of urinary retention and occasional UTIs.  Patient has had a decline here over the past several weeks- Her mental status has deteriorated somewhat not really speaking which has been fairly minimal for some time-not eating and drinking as well.  I did see HER-2 days ago and had a discussion with her husband-at that point he wanted to monitor her with no aggressive interventions labs etc.  However today I did speak with him and he is open to obtaining labs-we have obtain these and they show significantly elevated sodium of 163 creatinine is above her baseline at 1.52 she also has leukocytosis at 15.8.  Her vital signs actually appear to be stable --with a labs to take certainly a significant decline.  I did discuss her status and the labs with her husband as well as with her son Remo Lipps who is a doctor in the community-their emphasis is largely on comfort she does have aMOST form but they would like her hospitalized to rule out any possible reversible process infection etc.    .         -    "  .  Family medical social history has been reviewed per history and physical on 08/24/2011  .  Medications reviewed per [facility]  They include.  Benadryl 25 mg daily at bedtime when necessary.  Claritin 10 mg daily.  Colace 100 mg twice a day.  Dulcolax tablet when necessary.  Fleets enema when  necessary daily.  MiraLAX 17 g in 8 ounces of water daily.  Prilosec 20 mg daily.  Sulfasalazine 5 mg 3 times a day.  Systane eyedrops when necessary.  Trazodone 50 mg daily at bedtime when necessary insomnia .  Review of systems-stated in history of present illness again largely un attainable secondary to severe dementia --please see history of present illness--     Physical exam    Temperature 98.1 pulse 78 respirations 18 blood pressure 132/69 weight is 160 which is down about 5 pounds since the beginning of the month  .  General this is a frail but fairly  elderly female in no distress-- t appears to be quite weak somewhat bent over in her wheelchair .  Her skin is warm and dry-she does appear to have numerous seb keratoses which is baseline--   -  Eyes pupils equal round reactive to light sclera and conjunctiva areclear--visual acuity appears grossly intact.      Oropharynx somewhat limited exam since patient did not open her mouth   .  Chest -- clear to auscultation without  over rhonchi rales or wheezes no labored breathing. poor respiratory effort  makes exam somewhat difficult-does not really follow verbal commands   Heart is regular rate and rhythm without murmur gallop or rub--trace lower extremity edema .  Abdomen soft nontender positive bowel sounds .  Muscle skeletal-do not note any deformities-- But she is quite weak .  Neurologic as she does have severe dementia-cranial nerves appear grossly intact-I do not see any lateralizing findings. Although she is not following verbal instructions and this is somewhat difficult to fully assess Psych-again she does have severe dementia-has difficulty following verbal commands. Does not really make eye contact or speak at this time   Usually she is smiling but does not really attempt this today-  .  Labs 11/19/2014.  Sodium 163 potassium 3.5 BUN 45 creatinine 1.5 to.  Liver function tests  within normal limits.  WBC 15.8 hemoglobin 13.3 platelets 257    05/28/2015.  Sodium 140 potassium 3.8 BUN 13 creatinine 0.89.  Liver function tests within normal limits except ALT of 10.  WBC 8.6 hemoglobin 13.2 platelets 234   6/6 2016.  Sodium 140 potassium 3.9 BUN 16 creatinine 0.95.  WBC 6.3 hemoglobin 11.6 platelets 248   12/15/2014.  WBC 8.6 hemoglobin 12.5 platelets 225.  Sodium 142 potassium 4 BUN 19 creatinine 0.95 CO2 23  07/07/2014.  WBC 6.7 hemoglobin 12.7 platelets 257.  Sodium 141 potassium 4 BUN 15 creatinine 0.94-liver function tests within normal limits.      Assessment and plan.  -  Failure to thrive with significantly elevated sodium of 163 and white count of 15.8-these labs are acutely concerning-her family is aware of this fact her son who is in the facility is a physician in the community-as noted above they would like her hospitalized to rule out any possible reversible process-we have arranged a direct admit to the hospitalist service-.  [id number] note greater than 45 minutes spent assessing patient-discussing her status with family in the facility-as well as with the hospitalist-and coordinating and formulating a plan of care resulting in direct admission to the hospital.--Greater than 50% of time spent coordinating plan of care with family input as well as discussion with hospitalist via phone            .

## 2015-11-22 NOTE — Progress Notes (Signed)
[facility] PROGRESS NOTE  Theresa Ford ZOX:[id number] DOB: 11/21/1926 DOA: 11/19/2015 PCP: No primary care provider on file.  Assessment/Plan: #1 dehydration and hypernatremia Likely secondary to poor oral intake and in the setting of urinary tract infection. Urine cultures pending. Patient more alert and following some commands. Per family significant improvement since admission. Sodium levels trending back down and sodium at 142 from 148 from 158 from 165 from 163 on admission. Change IVF to 1/2NS. BMET in AM. Follow.  #2 urinary tract infection urine cultures pending. Continue empiric IV Levaquin.   #3 acute kidney injury Creatinine on admission was 1.52. Baseline apparently 0.9. Likely secondary to a prerenal azotemia secondary to poor oral intake and dehydration. Renal function trending down. Decreased IV fluid rate. Follow.  #4 fecal impaction/constipation Dehydration contributing to fecal impaction. Patient was given the mineral oil enema with no significant results. Patient was unable to be manually disimpacted per nursing. Patient given smog enema with good results. Continue current bowel regimen of MiraLAX daily and Senokot-S daily at bedtime.  #5 hypertension Stable.  #6 Alzheimer's dementia/FTT/Protein calorie malnuitrition Has been progressively worsening. Patient seems more alert today with improvement in her hypernatremia. Patient tolerating some of her dysphagia 1 diet. SLP pending.  #7 hyperglycemia Sliding scale insulin.  #8 leukocytosis Likely secondary to UTI. Urine cultures pending. Continue empiric IV Levaquin.  #9 Anemia Folate levels decreased. Continue folic acid.  #10 prophylaxis PPI for GI prophylaxis. Heparin for DVT prophylaxis.   Code Status: Full Family Communication: Updated husband and sons at bedside. Disposition Plan: Back to skilled nursing facility once hypernatremia has resolved, urine cultures have resulted in patient on oral  antibiotics, renal function has improved and medically stable.   Consultants:  None  Procedures:  Chest x-ray 11/19/2015  Abdominal x-rays 11/19/2015  Antibiotics:  IV Azactam 11/19/2015>>>>11/21/2015  IV Levaquin 11/19/2015  HPI/Subjective: Patient sleeping but easily arousable and following commands.alert and following some commands. Patient pleasantly confused. Some moaning.  Objective: Filed Vitals:   11/21/15 2144 11/22/15 0438  BP: 109/60 92/78  Pulse: 84 79  Temp: 97.1 F (36.2 C) 97.5 F (36.4 C)  Resp: 20 20    Intake/Output Summary (Last 24 hours) at 11/22/15 1200 Last data filed at 11/22/15 1115  Gross per 24 hour  Intake   1900 ml  Output      0 ml  Net   1900 ml   Filed Weights   11/20/15 0534 11/21/15 0500 11/22/15 0438  Weight: 71.351 kg (157 lb 4.8 oz) 72 kg (158 lb 11.7 oz) 72.4 kg (159 lb 9.8 oz)    Exam:   General:  NAD  Cardiovascular: RRR  Respiratory: CTAB  Abdomen: Soft, nontender, nondistended, positive bowel sounds.  Musculoskeletal: No clubbing cyanosis or edema.  Data Reviewed: Basic Metabolic Panel:  Recent Labs Lab 11/19/15 1739  11/20/15 0100 11/20/15 0515 11/20/15 1725 11/21/15 0554 11/22/15 0851  NA 160*  < > 161* 158* 147* 148* 142  K 3.3*  < > 4.0 4.4 3.7 3.5 3.5  CL 128*  < > 129* 129* 119* 116* 116*  CO2 25  < > 24 20* 22 24 21*  GLUCOSE 103*  < > 145* 113* 106* 125* 107*  BUN 42*  < > 37* 35* 27* 22* 17  CREATININE 1.37*  < > 1.30* 1.29* 1.19* 1.14* 0.99  CALCIUM 8.9  < > 9.0 8.9 8.6* 8.9 8.4*  MG 2.9*  --   --  3.3*  --   --   --   < > =  values in this interval not displayed. Liver Function Tests:  Recent Labs Lab 11/19/15 1116  AST 21  ALT 14  ALKPHOS 88  BILITOT 0.7  PROT 7.7  ALBUMIN 3.7   No results for input(s): LIPASE, AMYLASE in the last 168 hours. No results for input(s): AMMONIA in the last 168 hours. CBC:  Recent Labs Lab 11/19/15 11/19/15 1116 11/20/15 1005 11/21/15 0554  11/22/15 0851  WBC 15.8 15.8* 9.2 12.3* 7.4  NEUTROABS  --  11.8* 6.3 8.2*  --   HGB  --  13.3 7.9* 11.8* 10.9*  HCT  --  43.7 33.0* 39.0 35.0*  MCV  --  100.0 99.4 97.3 95.1  PLT  --  257 117* 229 203   Cardiac Enzymes:  Recent Labs Lab 11/19/15 1739 11/19/15 2307 11/20/15 0515  TROPONINI <0.03 <0.03 <0.03   BNP (last 3 results) No results for input(s): BNP in the last 8760 hours.  ProBNP (last 3 results) No results for input(s): PROBNP in the last 8760 hours.  CBG:  Recent Labs Lab 11/20/15 0127 11/20/15 0324 11/20/15 0429 11/20/15 0821 11/20/15 1225  GLUCAP 134* 132* 119* 101* 118*    Recent Results (from the past 240 hour(s))  MRSA PCR Screening     Status: Abnormal   Collection Time: 11/19/15  4:30 PM  Result Value Ref Range Status   MRSA by PCR POSITIVE (A) NEGATIVE Final    Comment:        The GeneXpert MRSA Assay (FDA approved for NASAL specimens only), is one component of a comprehensive MRSA colonization surveillance program. It is not intended to diagnose MRSA infection nor to guide or monitor treatment for MRSA infections. RESULT CALLED TO, READ BACK BY AND VERIFIED WITH: BIVENS,L ON 11/19/15 AT 2210 BY LOY,C   Urine culture     Status: None (Preliminary result)   Collection Time: 11/19/15  7:00 PM  Result Value Ref Range Status   Specimen Description URINE, CATHETERIZED  Final   Special Requests NONE  Final   Culture   Final    >=100,000 COLONIES/mL GRAM NEGATIVE RODS Performed at [facility]    Report Status PENDING  Incomplete     Studies: Dg Chest Port 1 View  11/21/2015  CLINICAL DATA:  PICC line placement EXAM: PORTABLE CHEST 1 VIEW COMPARISON:  11/19/2015. FINDINGS: Cardiomegaly. Tortuous aorta. PICC line from RIGHT arm approach lies at cavoatrial junction. Low lung volumes with accentuate pulmonary markings but there are no active infiltrates or failure. IMPRESSION: Satisfactory PICC line placement with the tip at the  cavoatrial junction. Electronically Signed   By: Elsie Stain M.D.   On: 11/21/2015 15:32    Scheduled Meds: . camphor-menthol  1 application Topical BID  . carbamide peroxide  10 drop Both Ears BID  . Chlorhexidine Gluconate Cloth  6 each Topical Q0600  . fluocinonide cream  1 application Topical BID  . folic acid  1 mg Oral Daily  . heparin  5,000 Units Subcutaneous 3 times per day  . insulin aspart  0-9 Units Subcutaneous 6 times per day  . levofloxacin (LEVAQUIN) IV  500 mg Intravenous Q48H  . loratadine  10 mg Oral Daily  . mupirocin ointment  1 application Nasal BID  . pantoprazole  40 mg Oral Daily  . polyethylene glycol  17 g Oral Daily  . senna-docusate  1 tablet Oral QHS  . sorbitol, milk of mag, mineral oil, glycerin (SMOG) enema  960 mL Rectal Once  . sulfaSALAzine  1,000 mg Oral TID   Continuous Infusions: . sodium chloride 0.45 % 1,000 mL with potassium chloride 40 mEq infusion      Principal Problem:   Dehydration with hypernatremia Active Problems:   HTN (hypertension)   Dementia in Alzheimer's disease   FTT (failure to thrive) in adult   Hypernatremia   Dehydration   Fecal impaction (HCC)   Leukocytosis   AKI (acute kidney injury) (HCC)   Hyperglycemia   Constipation   Protein-calorie malnutrition, severe    Time spent: 40 mins    [facility] MD [facility] Pager [phone]. If 7PM-7AM, please contact night-coverage at [email or web address], password [facility] 11/22/2015, 12:00 PM  LOS: 3 days

## 2015-11-22 NOTE — Evaluation (Signed)
Clinical/Bedside Swallow Evaluation Patient Details  Name: Theresa Ford MRN: [id number] Date of Birth: 08/19/1927  Today's Date: 11/22/2015 Time: SLP Start Time (ACUTE ONLY): 1500 SLP Stop Time (ACUTE ONLY): 1529 SLP Time Calculation (min) (ACUTE ONLY): 29 min  Past Medical History: History reviewed. No pertinent past medical history. Past Surgical History: History reviewed. No pertinent past surgical history. HPI:  Mrs. Theresa BarreBettie Ford is an 82 y.o. female with PMH of hypertension and advanced Alzheimer dementia who presents as a direct admission from her SNF [facility] with loss of appetite, subsequent dehydration, and critical hypernatremia. Patient was evaluated at the SNF with some basic blood work and noted to have a serum sodium of greater than 160. Radiographs of the chest and abdomen were also obtained and suggestive of fecal impaction. Pt decreased po intake over the past 10 days. Supplements were added due to her diminished appetite. Pt is dependent with feeding. Staff recently reporting pt refusing to eat which is a change in her baseline. She normally has been eating well and has maintained stable intake and weight for >1 yr. She has been on a pureed diet since 04/30/2013.    Assessment / Plan / Recommendation Clinical Impression  SLP attempted clinical swallow evaluation at bedside, however pt with advanced dementia and unable to follow directions. Her daughter in law was in room and provided background information. She reported that pt's husband, Theresa Ford, [facility] and feeds her. She typically eats pureed diet with thin liquids well, but decreased intake noted a few weeks ago. Theresa Ford opened eyes briefly, but mostly sat with her eyes closed, humming. She demonstates significant cognitive based dysphagia with decreased awareness and attention to task; she requires max cues to open mouth. Different techniques were tried in attempt to encourage po intake  (hand over hand assist on cup/spoon, brushing botttom lip, thermal/tactile stim, taking advantage of spontaneous opening of mouth, taking breaks...) and pt successfully took about 4 teaspoon presentations chocolate pudding over 3o minutes; a couple sips Ensure. Pt has been on a puree diet for about 3 years and this continues to be appropriate given current cognitive status. Recommend D1/puree with thin liquids (family reports pt preference for room temperature items), however intake will be limited. Feeding tubes are generally not recommended in patients with advanced dementia as they do not reduce risks for aspiration; hand feeding with frequent attempts has been found to be most effective. SLP will follow while in acute setting.     Aspiration Risk  Moderate aspiration risk    Diet Recommendation Dysphagia 1 (Puree);Thin liquid   Liquid Administration via: Cup;Spoon Medication Administration: Via alternative means Supervision: Full supervision/cueing for compensatory strategies;Staff to assist with self feeding (trained caregiver) Compensations: Slow rate;Small sips/bites;Monitor for anterior loss Postural Changes: Seated upright at 90 degrees;Remain upright for at least 30 minutes after po intake    Other  Recommendations Oral Care Recommendations: Oral care BID;Staff/trained caregiver to provide oral care Other Recommendations: Clarify dietary restrictions   Follow up Recommendations  24 hour supervision/assistance;Skilled Nursing facility    Frequency and Duration min 2x/week  1 week       Prognosis Prognosis for Safe Diet Advancement: Fair Barriers to Reach Goals: Cognitive deficits      Swallow Study   General Date of Onset: 11/19/15 HPI: Mrs. Theresa BarreBettie Killion is an 82 y.o. female with PMH of hypertension and advanced Alzheimer dementia who presents as a direct admission from her SNF [facility]) with  loss of appetite, subsequent dehydration, and critical hypernatremia. Patient was  evaluated at the SNF with some basic blood work and noted to have a serum sodium of greater than 160. Radiographs of the chest and abdomen were also obtained and suggestive of fecal impaction. Pt decreased po intake over the past 10 days. Supplements were added due to her diminished appetite. Pt is dependent with feeding. Staff recently reporting pt refusing to eat which is a change in her baseline. She normally has been eating well and has maintained stable intake and weight for >1 yr. She has been on a pureed diet since 04/30/2013.  Type of Study: Bedside Swallow Evaluation Diet Prior to this Study: Dysphagia 1 (puree);Thin liquids Temperature Spikes Noted: No Respiratory Status: Room air History of Recent Intubation: No Behavior/Cognition: Requires cueing Oral Cavity Assessment:  (very difficult to visualize inside due to decreased cognitio) Oral Care Completed by SLP: Recent completion by staff (attempts) Oral Cavity - Dentition: Poor condition Vision: Impaired for self-feeding Self-Feeding Abilities: Total assist Patient Positioning: Upright in bed Baseline Vocal Quality:  (humming only- clear) Volitional Cough: Cognitively unable to elicit Volitional Swallow: Unable to elicit    Oral/Motor/Sensory Function Overall Oral Motor/Sensory Function:  (pt unable to follow commands due to cognition)   Ice Chips Ice chips: Impaired Presentation: Spoon Oral Phase Impairments: Reduced labial seal Oral Phase Functional Implications: Oral holding Pharyngeal Phase Impairments: Suspected delayed Swallow   Thin Liquid Thin Liquid: Impaired Presentation: Cup;Spoon;Straw (attempts) Oral Phase Impairments: Reduced labial seal;Poor awareness of bolus Oral Phase Functional Implications: Right anterior spillage Pharyngeal  Phase Impairments: Suspected delayed Swallow    Nectar Thick Nectar Thick Liquid: Not tested   Honey Thick Honey Thick Liquid: Not tested   Puree Puree: Impaired Presentation:  Spoon Oral Phase Impairments: Reduced labial seal;Reduced lingual movement/coordination;Poor awareness of bolus Oral Phase Functional Implications: Right anterior spillage;Prolonged oral transit Pharyngeal Phase Impairments: Suspected delayed Swallow   Solid   Thank you,  Havery Moros, CCC-SLP [phone]    Solid: Not tested        PORTER,DABNEY 11/22/2015,3:39 PM

## 2015-11-23 DIAGNOSIS — N390 Urinary tract infection, site not specified: Secondary | ICD-10-CM | POA: Diagnosis present

## 2015-11-23 LAB — GLUCOSE, CAPILLARY
GLUCOSE-CAPILLARY: 127 mg/dL — AB (ref 65–99)
GLUCOSE-CAPILLARY: 137 mg/dL — AB (ref 65–99)
GLUCOSE-CAPILLARY: 147 mg/dL — AB (ref 65–99)
GLUCOSE-CAPILLARY: 79 mg/dL (ref 65–99)
GLUCOSE-CAPILLARY: 87 mg/dL (ref 65–99)
GLUCOSE-CAPILLARY: 92 mg/dL (ref 65–99)
GLUCOSE-CAPILLARY: 96 mg/dL (ref 65–99)
Glucose-Capillary: 140 mg/dL — ABNORMAL HIGH (ref 65–99)
Glucose-Capillary: 122 mg/dL — ABNORMAL HIGH (ref 65–99)
Glucose-Capillary: 82 mg/dL (ref 65–99)
Glucose-Capillary: 93 mg/dL (ref 65–99)
Glucose-Capillary: 98 mg/dL (ref 65–99)
Glucose-Capillary: 106 mg/dL — ABNORMAL HIGH (ref 65–99)
Glucose-Capillary: 100 mg/dL — ABNORMAL HIGH (ref 65–99)
Glucose-Capillary: 103 mg/dL — ABNORMAL HIGH (ref 65–99)
Glucose-Capillary: 98 mg/dL (ref 65–99)
Glucose-Capillary: 132 mg/dL — ABNORMAL HIGH (ref 65–99)
Glucose-Capillary: 120 mg/dL — ABNORMAL HIGH (ref 65–99)

## 2015-11-23 LAB — CBC
HEMATOCRIT: 35.3 % — AB (ref 36.0–46.0)
Hemoglobin: 11.1 g/dL — ABNORMAL LOW (ref 12.0–15.0)
MCH: 29.5 pg (ref 26.0–34.0)
MCHC: 31.4 g/dL (ref 30.0–36.0)
MCV: 93.9 fL (ref 78.0–100.0)
PLATELETS: 196 10*3/uL (ref 150–400)
RBC: 3.76 MIL/uL — AB (ref 3.87–5.11)
RDW: 15 % (ref 11.5–15.5)
WBC: 7.6 10*3/uL (ref 4.0–10.5)

## 2015-11-23 LAB — BASIC METABOLIC PANEL
Anion gap: 6 (ref 5–15)
BUN: 14 mg/dL (ref 6–20)
CHLORIDE: 116 mmol/L — AB (ref 101–111)
CO2: 22 mmol/L (ref 22–32)
CREATININE: 1.01 mg/dL — AB (ref 0.44–1.00)
Calcium: 8.9 mg/dL (ref 8.9–10.3)
GFR calc non Af Amer: 49 mL/min — ABNORMAL LOW (ref >60)
GFR, EST AFRICAN AMERICAN: 57 mL/min — AB (ref >60)
Glucose, Bld: 101 mg/dL — ABNORMAL HIGH (ref 65–99)
POTASSIUM: 5 mmol/L (ref 3.5–5.1)
Sodium: 144 mmol/L (ref 135–145)

## 2015-11-23 MED ORDER — DEXTROSE 5 % IV SOLN
INTRAVENOUS | Status: DC
  Administered 2015-11-23: 09:00:00 via INTRAVENOUS

## 2015-11-23 MED ORDER — CEPHALEXIN 500 MG PO CAPS
500.0000 mg | ORAL_CAPSULE | Freq: Two times a day (BID) | ORAL | Status: DC
  Administered 2015-11-23: 500 mg via ORAL
  Filled 2015-11-23: qty 1

## 2015-11-23 MED ORDER — DEXTROSE 5 % IV SOLN: INTRAVENOUS | Status: DC

## 2015-11-23 MED ORDER — DEXTROSE-NACL 5-0.45 % IV SOLN
INTRAVENOUS | Status: DC
  Filled 2015-11-23 (×2): qty 1000

## 2015-11-23 MED ORDER — CEPHALEXIN 250 MG/5ML PO SUSR: 500.0000 mg | Freq: Two times a day (BID) | ORAL | Status: DC

## 2015-11-23 MED ORDER — CEPHALEXIN 250 MG/5ML PO SUSR
500.0000 mg | Freq: Two times a day (BID) | ORAL | Status: DC
  Administered 2015-11-24: 500 mg via ORAL
  Filled 2015-11-23 (×4): qty 10

## 2015-11-23 MED ORDER — ACETAMINOPHEN 325 MG PO TABS: 650.0000 mg | ORAL_TABLET | Freq: Four times a day (QID) | ORAL | Status: DC | PRN

## 2015-11-23 MED ORDER — DEXTROSE-NACL 5-0.45 % IV SOLN
INTRAVENOUS | Status: DC
  Administered 2015-11-23: 21:00:00 via INTRAVENOUS

## 2015-11-23 NOTE — Progress Notes (Signed)
OT Cancellation Note  Patient Details Name: Theresa Ford MRN: [id number] DOB: [birth year]   Cancelled Treatment:    Reason Eval/Treat Not Completed: OT screened, no needs identified, will sign off. Patient's chart reviewed. Patient has advanced dementia which is progressing. Unable to follow one step commands. [facility] Recommend patient return to facility at discharge and be evaluated by staff OT if needed. Thank you for the referral.   Limmie PatriciaLaura Stuart Mirabile, OTR/L,CBIS  [phone]  11/23/2015, 8:27 AM

## 2015-11-23 NOTE — Progress Notes (Addendum)
Physical Therapy Evaluation Patient Details Name: Theresa Ford MRN: [id number] DOB: July 26, 1927 Today's Date: 11/23/2015   History of Present Illness  Per ER note on 11/19/2015; Theresa BarreBettie Vien is a 82 y.o. female with PMH of hypertension and advanced Alzheimer dementia who presents as a direct admission from her SNF with loss of appetite, subsequent dehydration, and critical hypernatremia. Patient was evaluated at the SNF with some basic blood work and noted to have a serum sodium of greater than 160. Radiographs of the chest and abdomen were also obtained and suggestive of fecal impaction. She was directly [facility] for ongoing evaluation and management of these problems. History is obtained through discussion with hospital personnel, chart review, and discussion with the patient's husband at the bedside. There's been no fevers noted and no vomiting or diarrhea. Patient is had a similar presentation with urinary tract infection in the past, but she does not have these frequently. There is been no rash or cough. The patient has remained alert, though confused.  Clinical Impression  Per family pt has not been walking for months.  Pt usually moved to a chair by  Nursing with two person assist or lift.   Due to prior level of function and dementia pt does not need skilled PT services.  Recommend lifting device to get pt into chair by nursing staff.                               Home Living Family/patient expects to be discharged to:: Skilled nursing facility                      Prior Function Level of Independence: Needs assistance  Total assist for transfering     AD[facility] / [facility]                    No charge                                Virgina OrganCynthia Missey Hasley, PT CLT [phone] 11/23/2015, 11:38 AM

## 2015-11-23 NOTE — Progress Notes (Signed)
[facility] PROGRESS NOTE  Jericha Bryden ZOX:[id number] DOB: 03/16/1927 DOA: 11/19/2015 PCP: No primary care provider on file.  Assessment/Plan: #1 dehydration and hypernatremia Likely secondary to poor oral intake and in the setting of urinary tract infection. Urine cultures pending. Patient more alert and following some commands. Per family significant improvement since admission. Sodium levels trending back down and sodium at 144 from 142 from 148 from 158 from 165 from 163 on admission. Change IVF to D51/2NS. BMET in AM. Follow.  #2 Proteus mirabilis urinary tract infection urine cultures consistent with Proteus mirabilis. Discontinue IV Levaquin as is resistant to the fluoroquinolones. Trial of Keflex and monitor closely as patient does have a history of penicillin allergy.   #3 acute kidney injury Creatinine on admission was 1.52. Baseline apparently 0.9. Likely secondary to a prerenal azotemia secondary to poor oral intake and dehydration. Renal function trending down. Decreased IV fluid rate. Follow.  #4 fecal impaction/constipation Dehydration contributing to fecal impaction. Patient was given the mineral oil enema with no significant results. Patient was unable to be manually disimpacted per nursing. Patient given smog enema with good results. Continue current bowel regimen of MiraLAX daily and Senokot-S daily at bedtime.  #5 hypertension Stable.  #6 Alzheimer's dementia/FTT/Protein calorie malnuitrition Has been progressively worsening. Patient seems more alert today with improvement in her hypernatremia. Patient tolerating some of her dysphagia 1 diet.   #7 hyperglycemia Sliding scale insulin.  #8 leukocytosis Likely secondary to UTI. Urine cultures with greater than 100,000 colonies of Proteus mirabilis which is penicillin sensitive sensitive to cephalosporins resistant to fluoroquinolones and Macrobid and Bactrim. Will discontinue IV Levaquin. Patient with a penicillin  allergy. Trial of Keflex.   #9 Anemia Folate levels decreased. Continue folic acid.  #10 prophylaxis PPI for GI prophylaxis. Heparin for DVT prophylaxis.   Code Status: Full Family Communication: Updated husband and sons at bedside. Disposition Plan: Back to skilled nursing facility once hypernatremia has resolved, urine cultures have resulted in patient on oral antibiotics, renal function has improved and medically stable, hopefully in 1-2 days.   Consultants:  None  Procedures:  Chest x-ray 11/19/2015  Abdominal x-rays 11/19/2015  Antibiotics:  IV Azactam 11/19/2015>>>>11/21/2015  IV Levaquin 11/19/2015>>>> 11/23/2015  Oral Keflex 11/23/2015  HPI/Subjective: Patient sleeping but easily arousable and following commands.alert and following some commands. Patient pleasantly confused.  Objective: Filed Vitals:   11/22/15 2214 11/23/15 0611  BP: 122/71 128/61  Pulse: 72 72  Temp: 97.1 F (36.2 C) 98.1 F (36.7 C)  Resp: 20 20    Intake/Output Summary (Last 24 hours) at 11/23/15 1303 Last data filed at 11/22/15 1839  Gross per 24 hour  Intake 256.25 ml  Output      0 ml  Net 256.25 ml   Filed Weights   11/21/15 0500 11/22/15 0438 11/23/15 0611  Weight: 72 kg (158 lb 11.7 oz) 72.4 kg (159 lb 9.8 oz) 73.1 kg (161 lb 2.5 oz)    Exam:   General:  NAD  Cardiovascular: RRR  Respiratory: CTAB  Abdomen: Soft, nontender, nondistended, positive bowel sounds.  Musculoskeletal: No clubbing cyanosis or edema.  Data Reviewed: Basic Metabolic Panel:  Recent Labs Lab 11/19/15 1739  11/20/15 0515 11/20/15 1725 11/21/15 0554 11/22/15 0851 11/23/15 0534  NA 160*  < > 158* 147* 148* 142 144  K 3.3*  < > 4.4 3.7 3.5 3.5 5.0  CL 128*  < > 129* 119* 116* 116* 116*  CO2 25  < > 20* 22 24 21* 22  GLUCOSE 103*  < > 113* 106* 125* 107* 101*  BUN 42*  < > 35* 27* 22* 17 14  CREATININE 1.37*  < > 1.29* 1.19* 1.14* 0.99 1.01*  CALCIUM 8.9  < > 8.9 8.6* 8.9 8.4*  8.9  MG 2.9*  --  3.3*  --   --   --   --   < > = values in this interval not displayed. Liver Function Tests:  Recent Labs Lab 11/19/15 1116  AST 21  ALT 14  ALKPHOS 88  BILITOT 0.7  PROT 7.7  ALBUMIN 3.7   No results for input(s): LIPASE, AMYLASE in the last 168 hours. No results for input(s): AMMONIA in the last 168 hours. CBC:  Recent Labs Lab 11/19/15 1116 11/20/15 1005 11/21/15 0554 11/22/15 0851 11/23/15 0534  WBC 15.8* 9.2 12.3* 7.4 7.6  NEUTROABS 11.8* 6.3 8.2*  --   --   HGB 13.3 7.9* 11.8* 10.9* 11.1*  HCT 43.7 33.0* 39.0 35.0* 35.3*  MCV 100.0 99.4 97.3 95.1 93.9  PLT 257 117* 229 203 196   Cardiac Enzymes:  Recent Labs Lab 11/19/15 1739 11/19/15 2307 11/20/15 0515  TROPONINI <0.03 <0.03 <0.03   BNP (last 3 results) No results for input(s): BNP in the last 8760 hours.  ProBNP (last 3 results) No results for input(s): PROBNP in the last 8760 hours.  CBG:  Recent Labs Lab 11/22/15 1205 11/22/15 1650 11/22/15 1948 11/23/15 0116 11/23/15 0725  GLUCAP 92 100* 103* 98 87    Recent Results (from the past 240 hour(s))  MRSA PCR Screening     Status: Abnormal   Collection Time: 11/19/15  4:30 PM  Result Value Ref Range Status   MRSA by PCR POSITIVE (A) NEGATIVE Final    Comment:        The GeneXpert MRSA Assay (FDA approved for NASAL specimens only), is one component of a comprehensive MRSA colonization surveillance program. It is not intended to diagnose MRSA infection nor to guide or monitor treatment for MRSA infections. RESULT CALLED TO, READ BACK BY AND VERIFIED WITH: BIVENS,L ON 11/19/15 AT 2210 BY LOY,C   Urine culture     Status: None   Collection Time: 11/19/15  7:00 PM  Result Value Ref Range Status   Specimen Description URINE, CATHETERIZED  Final   Special Requests NONE  Final   Culture   Final    >=100,000 COLONIES/mL PROTEUS MIRABILIS [facility]    Report Status 11/22/2015 FINAL  Final    Organism ID, Bacteria PROTEUS MIRABILIS  Final      Susceptibility   Proteus mirabilis - MIC*    AMPICILLIN 4 SENSITIVE Sensitive     CEFAZOLIN <=4 SENSITIVE Sensitive     CEFTRIAXONE <=1 SENSITIVE Sensitive     CIPROFLOXACIN >=4 RESISTANT Resistant     GENTAMICIN >=16 RESISTANT Resistant     IMIPENEM 1 SENSITIVE Sensitive     NITROFURANTOIN 64 RESISTANT Resistant     TRIMETH/SULFA >=320 RESISTANT Resistant     AMPICILLIN/SULBACTAM 4 SENSITIVE Sensitive     PIP/TAZO <=4 SENSITIVE Sensitive     * >=100,000 COLONIES/mL PROTEUS MIRABILIS     Studies: Dg Chest Port 1 View  11/21/2015  CLINICAL DATA:  PICC line placement EXAM: PORTABLE CHEST 1 VIEW COMPARISON:  11/19/2015. FINDINGS: Cardiomegaly. Tortuous aorta. PICC line from RIGHT arm approach lies at cavoatrial junction. Low lung volumes with accentuate pulmonary markings but there are no active infiltrates or failure. IMPRESSION: Satisfactory PICC line  placement with the tip at the cavoatrial junction. Electronically Signed   By: Elsie Stain M.D.   On: 11/21/2015 15:32    Scheduled Meds: . camphor-menthol  1 application Topical BID  . carbamide peroxide  10 drop Both Ears BID  . cephALEXin  500 mg Oral Q12H  . Chlorhexidine Gluconate Cloth  6 each Topical Q0600  . fluocinonide cream  1 application Topical BID  . folic acid  1 mg Oral Daily  . heparin  5,000 Units Subcutaneous 3 times per day  . insulin aspart  0-9 Units Subcutaneous 6 times per day  . loratadine  10 mg Oral Daily  . mupirocin ointment  1 application Nasal BID  . pantoprazole  40 mg Oral Daily  . polyethylene glycol  17 g Oral Daily  . senna-docusate  1 tablet Oral QHS  . sorbitol, milk of mag, mineral oil, glycerin (SMOG) enema  960 mL Rectal Once  . sulfaSALAzine  1,000 mg Oral TID   Continuous Infusions: . dextrose 75 mL/hr at 11/23/15 0845    Principal Problem:   Dehydration with hypernatremia Active Problems:   HTN (hypertension)   Dementia in  Alzheimer's disease   FTT (failure to thrive) in adult   Hypernatremia   Dehydration   Fecal impaction (HCC)   Leukocytosis   AKI (acute kidney injury) (HCC)   Hyperglycemia   Constipation   Protein-calorie malnutrition, severe   Acute UTI    Time spent: 40 mins    [facility] MD [facility] Pager [phone]. If 7PM-7AM, please contact night-coverage at [email or web address], password [facility] 11/23/2015, 1:03 PM  LOS: 4 days

## 2015-11-24 ENCOUNTER — Inpatient Hospital Stay
Admission: RE | Admit: 2015-11-24 | Discharge: 2016-09-29 | Disposition: E | Payer: Medicare Other | Source: Ambulatory Visit | Attending: Internal Medicine | Admitting: Internal Medicine

## 2015-11-24 LAB — GLUCOSE, CAPILLARY
GLUCOSE-CAPILLARY: 110 mg/dL — AB (ref 65–99)
GLUCOSE-CAPILLARY: 126 mg/dL — AB (ref 65–99)
Glucose-Capillary: 134 mg/dL — ABNORMAL HIGH (ref 65–99)
Glucose-Capillary: 110 mg/dL — ABNORMAL HIGH (ref 65–99)

## 2015-11-24 LAB — BASIC METABOLIC PANEL
Anion gap: 8 (ref 5–15)
BUN: 12 mg/dL (ref 6–20)
CHLORIDE: 113 mmol/L — AB (ref 101–111)
CO2: 22 mmol/L (ref 22–32)
CREATININE: 0.86 mg/dL (ref 0.44–1.00)
Calcium: 8.7 mg/dL — ABNORMAL LOW (ref 8.9–10.3)
GFR calc non Af Amer: 59 mL/min — ABNORMAL LOW (ref >60)
Glucose, Bld: 125 mg/dL — ABNORMAL HIGH (ref 65–99)
POTASSIUM: 3.6 mmol/L (ref 3.5–5.1)
SODIUM: 143 mmol/L (ref 135–145)

## 2015-11-24 MED ORDER — DEXTROSE-NACL 5-0.45 % IV SOLN
INTRAVENOUS | Status: DC
  Administered 2015-11-24: 08:00:00 via INTRAVENOUS

## 2015-11-24 MED ORDER — SENNOSIDES-DOCUSATE SODIUM 8.6-50 MG PO TABS
1.0000 | ORAL_TABLET | Freq: Every day | ORAL | Status: DC
Start: 2015-11-24 — End: 2015-11-26

## 2015-11-24 MED ORDER — CEPHALEXIN 250 MG/5ML PO SUSR: 500.0000 mg | Freq: Two times a day (BID) | ORAL | Status: DC

## 2015-11-24 MED ORDER — FOLIC ACID 1 MG PO TABS: 1.0000 mg | ORAL_TABLET | Freq: Every day | ORAL | Status: AC

## 2015-11-24 NOTE — Clinical Documentation Improvement (Signed)
Internal Medicine  Can the diagnosis of Malnutrition be further specified? Please document the degree of Malnutrition you feel your patient has in next progress note. Thank you!   Document Severity - Severe(third degree), Moderate (second degree), Mild (first degree)  Other condition  Unable to clinically determine  Document any associated diagnoses/conditions  Supporting Information:   BMI is noted to be 27  Please exercise your independent, professional judgment when responding. A specific answer is not anticipated or expected.  Thank You, Shellee MiloEileen T Nisreen Guise RN, BSN, CCDS Health Information Management [place] [phone]: Cell: [phone]

## 2015-11-24 NOTE — Discharge Summary (Signed)
Physician Discharge Summary  Theresa Ford ZOX:[id number] DOB: 06/16/1927 DOA: 11/19/2015  PCP: No primary care provider on file.  Admit date: 11/19/2015 Discharge date: 11/03/2015  Time spent: 65 minutes  Recommendations for Outpatient Follow-up:  1. Follow-up with M.D. at skilled nursing facility. Oral intake needs to be encouraged as well as free water intake. Patient will need a basic metabolic profile done in about 5-7 days to follow-up on electrolytes and renal function. 2. May consider palliative care evaluation at facility due to probable worsening Alzheimer's dementia.   Discharge Diagnoses:  Principal Problem:   Dehydration with hypernatremia Active Problems:   HTN (hypertension)   Dementia in Alzheimer's disease   FTT (failure to thrive) in adult   Hypernatremia   Dehydration   Fecal impaction (HCC)   Leukocytosis   AKI (acute kidney injury) (HCC)   Hyperglycemia   Constipation   Protein-calorie malnutrition, severe   Acute UTI   Discharge Condition: Stable and improved  Diet recommendation: Dysphagia 1 diet.  Filed Weights   11/22/15 0438 11/23/15 0611 11/09/2015 0540  Weight: 72.4 kg (159 lb 9.8 oz) 73.1 kg (161 lb 2.5 oz) 73.483 kg (162 lb)    History of present illness:  Per Dr Nedra Hai is a 82 y.o. female with PMH of hypertension and advanced Alzheimer dementia who presented as a direct admission from her SNF with loss of appetite, subsequent dehydration, and critical hypernatremia. Patient was evaluated at the SNF with some basic blood work and noted to have a serum sodium of greater than 160. Radiographs of the chest and abdomen were also obtained and suggestive of fecal impaction. She was directly admitted to [facility] for ongoing evaluation and management of these problems. History was obtained through discussion with hospital personnel, chart review, and discussion with the patient's husband at the bedside. There's been no fevers noted and no  vomiting or diarrhea. Patient has had a similar presentation with urinary tract infection in the past, but she does not have these frequently. There is been no rash or cough. The patient has remained alert, though confused.  She is evaluated on the general floor at [facility] And found to be afebrile, saturating well on room air, and with vital signs stable. Blood work from the SNF is reviewed and notable for a serum sodium of 163, serum chloride greater than 130, and serum creatinine of 1.52, up from an apparent baseline of 0.9. CBC features a leukocytosis to 15,800. She has been admitted to the hospital and will be managed for her dehydration, electrolyte derangements, and suspected infection.  Hospital Course:  #1 dehydration and hypernatremia Likely secondary to poor oral intake and in the setting of urinary tract infection. Patient was pancultured on admission and placed empirically on IV Levaquin and IV Azactam due to penicillin allergy. Patient was placed on D5W and followed. Patient initially was more lethargic however with improvement in her hyponatremia and electrolyte abnormalities patient became more alert was following some commands and interact and however pleasantly confused secondary to her Alzheimer's dementia. On admission patient was noted to have a sodium level of 163 which trended down during the hospitalization and the hyponatremia had resolved by day of discharge. Sodium levels by day of discharge was 143. Urine cultures came back positive as Proteus mirabilis and antibiotics adjusted appropriately. Patient be discharged to the nursing facility in stable and improved condition and close to baseline. Outpatient follow-up.   #2 Proteus mirabilis urinary tract infection On admission urinalysis which was  done was consistent with a urinary tract infection. Patient was initially placed empirically on IV Levaquin and IV Azactam due to her penicillin allergy while she was pancultured. Urine  cultures consistent with Proteus mirabilis. IV Azactam was subsequently discontinued. Cultures were resistant to fluoroquinolones, Macrobid and Bactrim and sensitive to penicillin and cephalosporins. Patient was noted to have an allergy to penicillin. Patient was tried on Keflex which she tolerated and patient be discharged back to the nursing facility with 6 more days of oral Keflex to complete a course of antibiotic treatment.   #3 acute kidney injury Creatinine on admission was 1.52. Baseline apparently 0.9. Likely secondary to a prerenal azotemia secondary to poor oral intake and dehydration. Renal function improved with hydration and acute kidney injury had resolved by day of discharge.   #4 fecal impaction/constipation Dehydration contributing to fecal impaction. Patient was given the mineral oil enema with no significant results. Patient was unable to be manually disimpacted per nursing. Patient given smog enema with good results. Patient was then subsequently placed on a bowel regimen of MiraLAX daily and Senokot-S daily at bedtime.  #5 hypertension Stable.  #6 Alzheimer's dementia/FTT/Protein calorie malnuitrition Has been progressively worsening. Patient on admission was more lethargic however with correction of her electrolyte abnormalities and IV antibiotics and IV fluids patient became more alert and was tolerating a dysphagia diet. Patient may benefit from palliative care consultation at the facility due to a progressive worsening of Alzheimer's dementia.   #7 hyperglycemia Sliding scale insulin.  #8 leukocytosis Likely secondary to UTI. Urine cultures with greater than 100,000 colonies of Proteus mirabilis which is penicillin sensitive sensitive to cephalosporins resistant to fluoroquinolones and Macrobid and Bactrim. Patient on admission was placed empirically on IV Levaquin and IV Azactam. IV Azactam was discontinued and patient remained on IV Levaquin. When urine cultures came  back patient was transitioned from IV Levaquin to oral Keflex. Patient tolerated Keflex without any reaction. Patient be discharged to the nursing facility on 6 more days of oral Keflex to complete a course of antibiotic treatment.   #9 severe protein calorie malnutrition  #10 Anemia Folate levels decreased. Patient was started on folic acid. Hemoglobin remained stable throughout the hospitalization. No signs of bleeding.  Procedures:  Chest x-ray 11/19/2015  Abdominal x-rays 11/19/2015    Consultations:  None  Discharge Exam: Filed Vitals:   11/23/15 2137 11/20/2015 0540  BP: 124/87 120/64  Pulse: 68 71  Temp: 98.6 F (37 C) 97.8 F (36.6 C)  Resp: 20 20    General: Pleasantly confused. Cardiovascular: RRR Respiratory: CTAB anterior lung fields.  Discharge Instructions   Discharge Instructions    Diet general    Complete by:  As directed   Dysphagia 1 diet     Discharge instructions    Complete by:  As directed   Follow up with MD at SNF     Increase activity slowly    Complete by:  As directed           Current Discharge Medication List    START taking these medications   Details  cephALEXin (KEFLEX) 250 MG/5ML suspension Take 10 mLs (500 mg total) by mouth every 12 (twelve) hours. Take for 6 days then stop. Qty: 100 mL, Refills: 0    folic acid (FOLVITE) 1 MG tablet Take 1 tablet (1 mg total) by mouth daily.    senna-docusate (SENOKOT-S) 8.6-50 MG tablet Take 1 tablet by mouth at bedtime.      CONTINUE these medications  which have NOT CHANGED   Details  acetaminophen (TYLENOL) 325 MG tablet Take 650 mg by mouth every 4 (four) hours as needed for mild pain or moderate pain.     bisacodyl (DULCOLAX) 5 MG EC tablet Take 5 mg by mouth daily as needed for moderate constipation.    carbamide peroxide (DEBROX) 6.5 % otic solution Place 10 drops into both ears 2 (two) times daily.    guaiFENesin-dextromethorphan (ROBITUSSIN DM) 100-10 MG/5ML syrup Take 5  mLs by mouth every 6 (six) hours as needed for cough.    !! ipratropium (ATROVENT) 0.02 % nebulizer solution Take 0.5 mg by nebulization every 6 (six) hours as needed for wheezing or shortness of breath.    !! IPRATROPIUM BROMIDE IN 2.64ml inhalation every 6 hours as needed for wheezing, SOB, Congestion    loratadine (CLARITIN) 10 MG tablet Take 10 mg by mouth daily.    omeprazole (PRILOSEC) 20 MG capsule Take 20 mg by mouth every evening.     polyethylene glycol (MIRALAX / GLYCOLAX) packet Take 17 g by mouth daily.    Propylene Glycol (SYSTANE BALANCE) 0.6 % SOLN Apply to eye 2 (two) times daily as needed (For dry eyes).    Sodium Phosphates (FLEET ENEMA RE) Place 133 mLs rectally as needed (For Constipation).    sulfaSALAzine (AZULFIDINE) 500 MG tablet Take 1,000 mg by mouth 3 (three) times daily.    traZODone (DESYREL) 50 MG tablet Take 50 mg by mouth at bedtime as needed for sleep.     !! - Potential duplicate medications found. Please discuss with provider.    STOP taking these medications     camphor-menthol (SARNA) lotion      fluocinonide cream (LIDEX) 0.05 %        Allergies  Allergen Reactions  . Penicillins     Unknown reaction-Listed on MAR    Follow-up Information    Please follow up.   Why:  f/u with MD at SNF       The results of significant diagnostics from this hospitalization (including imaging, microbiology, ancillary and laboratory) are listed below for reference.    Significant Diagnostic Studies: Dg Chest 1 View  11/19/2015  CLINICAL DATA:  Failure to thrive EXAM: CHEST 1 VIEW COMPARISON:  None. FINDINGS: And AP sitting erect view of the chest shows no focal infiltrate or effusion. Mediastinal and hilar contours are unremarkable. The heart is within upper limits of normal. No bony abnormality is seen. IMPRESSION: No active disease. Electronically Signed   By: Dwyane Dee M.D.   On: 11/19/2015 11:05   Dg Abd 1 View  11/19/2015  CLINICAL DATA:   Failure to thrive EXAM: ABDOMEN - 1 VIEW COMPARISON:  None. FINDINGS: A supine film of the abdomen shows no bowel obstruction. There is a moderate to large amount of feces particularly in the rectosigmoid colon which may indicate fecal impaction. No opaque calculi are seen. The bones are osteopenic. IMPRESSION: Significant amount of feces within the rectum suspicious for fecal impaction. No bowel obstruction. Electronically Signed   By: Dwyane Dee M.D.   On: 11/19/2015 11:04   Dg Chest Port 1 View  11/21/2015  CLINICAL DATA:  PICC line placement EXAM: PORTABLE CHEST 1 VIEW COMPARISON:  11/19/2015. FINDINGS: Cardiomegaly. Tortuous aorta. PICC line from RIGHT arm approach lies at cavoatrial junction. Low lung volumes with accentuate pulmonary markings but there are no active infiltrates or failure. IMPRESSION: Satisfactory PICC line placement with the tip at the cavoatrial junction. Electronically Signed  By: Elsie Stain M.D.   On: 11/21/2015 15:32    Microbiology: Recent Results (from the past 240 hour(s))  MRSA PCR Screening     Status: Abnormal   Collection Time: 11/19/15  4:30 PM  Result Value Ref Range Status   MRSA by PCR POSITIVE (A) NEGATIVE Final    Comment:        The GeneXpert MRSA Assay (FDA approved for NASAL specimens only), is one component of a comprehensive MRSA colonization surveillance program. It is not intended to diagnose MRSA infection nor to guide or monitor treatment for MRSA infections. RESULT CALLED TO, READ BACK BY AND VERIFIED WITH: BIVENS,L ON 11/19/15 AT 2210 BY LOY,C   Urine culture     Status: None   Collection Time: 11/19/15  7:00 PM  Result Value Ref Range Status   Specimen Description URINE, CATHETERIZED  Final   Special Requests NONE  Final   Culture   Final    >=100,000 COLONIES/mL PROTEUS MIRABILIS Performed at [facility]    Report Status 11/22/2015 FINAL  Final   Organism ID, Bacteria PROTEUS MIRABILIS  Final      Susceptibility    Proteus mirabilis - MIC*    AMPICILLIN 4 SENSITIVE Sensitive     CEFAZOLIN <=4 SENSITIVE Sensitive     CEFTRIAXONE <=1 SENSITIVE Sensitive     CIPROFLOXACIN >=4 RESISTANT Resistant     GENTAMICIN >=16 RESISTANT Resistant     IMIPENEM 1 SENSITIVE Sensitive     NITROFURANTOIN 64 RESISTANT Resistant     TRIMETH/SULFA >=320 RESISTANT Resistant     AMPICILLIN/SULBACTAM 4 SENSITIVE Sensitive     PIP/TAZO <=4 SENSITIVE Sensitive     * >=100,000 COLONIES/mL PROTEUS MIRABILIS     Labs: Basic Metabolic Panel:  Recent Labs Lab 11/19/15 1739  11/20/15 0515 11/20/15 1725 11/21/15 0554 11/22/15 0851 11/23/15 0534 12-19-2015 0525  NA 160*  < > 158* 147* 148* 142 144 143  K 3.3*  < > 4.4 3.7 3.5 3.5 5.0 3.6  CL 128*  < > 129* 119* 116* 116* 116* 113*  CO2 25  < > 20* 22 24 21* 22 22  GLUCOSE 103*  < > 113* 106* 125* 107* 101* 125*  BUN 42*  < > 35* 27* 22* 17 14 12   CREATININE 1.37*  < > 1.29* 1.19* 1.14* 0.99 1.01* 0.86  CALCIUM 8.9  < > 8.9 8.6* 8.9 8.4* 8.9 8.7*  MG 2.9*  --  3.3*  --   --   --   --   --   < > = values in this interval not displayed. Liver Function Tests:  Recent Labs Lab 11/19/15 1116  AST 21  ALT 14  ALKPHOS 88  BILITOT 0.7  PROT 7.7  ALBUMIN 3.7   No results for input(s): LIPASE, AMYLASE in the last 168 hours. No results for input(s): AMMONIA in the last 168 hours. CBC:  Recent Labs Lab 11/19/15 1116 11/20/15 1005 11/21/15 0554 11/22/15 0851 11/23/15 0534  WBC 15.8* 9.2 12.3* 7.4 7.6  NEUTROABS 11.8* 6.3 8.2*  --   --   HGB 13.3 7.9* 11.8* 10.9* 11.1*  HCT 43.7 33.0* 39.0 35.0* 35.3*  MCV 100.0 99.4 97.3 95.1 93.9  PLT 257 117* 229 203 196   Cardiac Enzymes:  Recent Labs Lab 11/19/15 1739 11/19/15 2307 11/20/15 0515  TROPONINI <0.03 <0.03 <0.03   BNP: BNP (last 3 results) No results for input(s): BNP in the last 8760 hours.  ProBNP (last 3  results) No results for input(s): PROBNP in the last 8760 hours.  CBG:  Recent Labs Lab  11/23/15 1633 11/23/15 1945 11/09/2015 0013 11/25/2015 0421 10/30/2015 0734  GLUCAP 120* 126* 110* 134* 110*       Signed:  Sriyan Cutting MD.  [facility] 11/04/2015, 1:43 PM

## 2015-11-24 NOTE — Care Management Important Message (Signed)
Important Message  Patient Details  Name: Theresa BarreBettie Lastra MRN: [id number] Date of Birth: 08-15-1927   Medicare Important Message Given:  Yes    Adonis HugueninBerkhead, Keairra Bardon L, RN Jan 28, 2016, 4:48 PM

## 2015-11-24 NOTE — Progress Notes (Addendum)
Plans are for patient to return to SNF today: [facility] per MD. Facility notified and agreeable for patient to return. Fl2 updated and MD signed.  Clinicals sent to facility. Family notified of DC.  Husband and sister in law at bedside.  Both agreeable to return. RN aware of DC, awaiting PICC line removed and then hospital staff to dc/transfer. No other needs at this time.   Deretha EmoryHannah Rafeal Skibicki LCSW, MSW

## 2015-11-24 NOTE — Progress Notes (Signed)
Called report on pt to nurse at [facility]. PICC line was removed. Pt in stable condition.

## 2015-11-25 ENCOUNTER — Encounter: Payer: Self-pay | Admitting: Internal Medicine

## 2015-11-25 ENCOUNTER — Non-Acute Institutional Stay (SKILLED_NURSING_FACILITY): Payer: Medicare Other | Admitting: Internal Medicine

## 2015-11-25 DIAGNOSIS — F0280 Dementia in other diseases classified elsewhere without behavioral disturbance: Secondary | ICD-10-CM

## 2015-11-25 DIAGNOSIS — G309 Alzheimer's disease, unspecified: Secondary | ICD-10-CM | POA: Diagnosis not present

## 2015-11-25 DIAGNOSIS — N179 Acute kidney failure, unspecified: Secondary | ICD-10-CM

## 2015-11-25 DIAGNOSIS — E870 Hyperosmolality and hypernatremia: Secondary | ICD-10-CM

## 2015-11-25 DIAGNOSIS — H109 Unspecified conjunctivitis: Secondary | ICD-10-CM

## 2015-11-25 DIAGNOSIS — K5641 Fecal impaction: Secondary | ICD-10-CM

## 2015-11-25 DIAGNOSIS — R627 Adult failure to thrive: Secondary | ICD-10-CM | POA: Diagnosis not present

## 2015-11-25 LAB — GLUCOSE, CAPILLARY: Glucose-Capillary: 122 mg/dL — ABNORMAL HIGH (ref 65–99)

## 2015-11-25 NOTE — Progress Notes (Signed)
Patient ID: Theresa Ford, female   DOB: 1926-11-03, 82 y.o.   MRN: [id number]             this is an acute visit Level of care skilled.  Facility [facility].   Chief complaint --acute visit status post hospitalization for hypernatremia failure to thrive .  History of present illness.  Patient is a pleasant elderly resident with a history of significant dementia  She has been quite stable with supportive care for an extended period of time however recently has significantly declined.  She had poor by mouth intake and and has started some behaviors like fairly constant humming at time.  Her husband has desired conservative interventions with minimal lab draws or radiology studies.  Last week secondary to her continued poor by mouth intake and what appeared to be significant progression of dementia labs were drawn which were concerning for sodium of 163 and a white count of 15,800.  Her creatinine was up from baseline at 1.51 as well.  This was discussed with her husband as well as with her son Viviann Spare who actually is a physician in the community-she was directly admitted to the hospital and aggressively rehydrated.  She also was found to have a UTI and received empiric antibiotics and discharged on Keflex which is effective against Proteus.  She also was found to have a significant fecal impaction manual disimpaction was not successful she did respond to an enema-she is now on a fairly aggressive bowel regimen which includes bisacodyl suppository when necessary-Colace 100 mg twice a day-fleets enema daily when necessary.  MiraLAX daily-andSorbitol  30 mL when necessary   There was suggestion for possibly a palliative care consult if dementia continues to progress and patient does not improve.  Breasts currently patient's vital signs are stable-I initially evaluated her in bed she was resting comfortably although again was having humming episodes-later did see her in her chair-she  appeared to be at baseline again humming sound did at times appear to try to respond to verbal commands but dementia appears to be pretty significant here  Apparently her by mouth intake continues to be quite poor.  Previous medical history.  All time #dementia.  Failure to thrive.  Hypernatremia.  UTI.  Equal tactile.  History of ulcerative colitis.  History of conjunctivitis.  History of leukocytosis resolved.  His stream acute kidney injury improved.  Social history-patient is been a long-term resident of this facility she does have 2 sons both who are physicians in town-and a daughter who lives in the [place] has a husband who is very supportive.  No apparent history of significant tobacco alcohol or illicit drug use.  Family history currently unobtainable.  Medications.  Bisacodyl suppository when necessary daily.  Keflex 5 mg twice a day until 11/30/2015.  Claritin 10 mg by mouth daily.  Colace 100 mg twice a day.  Fleets enema daily when necessary.  Folic acid 1 mg daily when necessary Atrovent nebs every 6 hours when necessary.  MiraLAX daily.  Prilosec 20 mg daily.  Sorbitol 30 mL when necessary daily.  Trazodone 50 mg daily at bedtime when necessary.  Sulfasalazine 1000 mg TID  Review of systems essentially unattainable please see history of present illness.  Physical exam.  Temperature 97.7 pulse 72 respirations 20 blood pressure 132/70 O2 saturation 94% on room air.  In general this is a frail elderly female in no distress does not really make eye contact or knowledge verbal prompts-she is following holding her eyes fairly  tightly closed.  Does report she does have some exudate from her eyes does have a history of conjunctivitis.  Skin is warm and dry she has numerous seborrheic keratosis.  Eyes as noted above-.  Oropharynx patient did not really open her mouth.  Chest poor respiratory effort but no overt congestion or labored  breathing noted.  Heart distant heart sounds regular rate and rhythm without significant lower extremity edema.  Abdomen soft protuberant soft with slightly hypoactive bowel sounds does not appear to be overtly tender to palpation.  Musculoskeletal does move all extremities 4 does not really follow verbal prompts however.  Neurologic appears able to move all extremities 4 I could not really see any lateralizing findings again this was difficult secondary to patient not following verbal commands.  Psych compatible with significant dementia.  Labs.  12-16-2015.  Sodium 143 potassium 3.6 BUN 12 creatinine 0.86.  11/23/2015.  WBC 7.6 hemoglobin 11.1 platelets 196.  Assessment and plan.  #1-history of failure to thrive with hypernatremia-I suspect this will continue to be a challenge with poor by mouth intake again fluids and by mouth intake are strongly encourage her husband who is very supportive has been involved in this as well-at this point will monitor at some point consider palliative care consult if status does not improve.  At this point patient's husband defers follow-up labs but-will revaluate in a couple days.  #2 UTI-she is completing a course of Keflex for Proteus-she is currently afebrile.  #3 history of acute kidney injury creatinine is back close to baseline at 0.86-again this will continue to be a challenge of fair by mouth intake does not improve.  #4 history of fecal impaction and as noted above she is on numerous bowel agents at this point will monitor.  #5- leukocytosis-this appears resolved with antibiotic treatment again there is questionable follow-up labs-again will defer to family wishes.  #6 history of conjunctivitis her husband who is quite attentive she has some exudate again in both eyes will treat with gentamicin eyedrops which have been effective in the past we'll do a seven-day course of this.  #7 history of ulcerative colitis she continues on  Sulfasalazine this is been stable for an extended period of time.  Number a history of folic acid deficiency she was thought to be deficient in the hospital she is now on folic acid.  [id number] note greater than 40 minutes spent assessing patient-discussing her status with her husband-and  coordinating and formulating a plan of care for numerous diagnoses-of note greater than 50% of time spent coordinating plan of care with family input    .         -    "  .  Family medical social history has been reviewed per history and physical on 08/24/2011  .  Medications reviewed per [facility]  They include.  Benadryl 25 mg daily at bedtime when necessary.  Claritin 10 mg daily.  Colace 100 mg twice a day.  Dulcolax tablet when necessary.  Fleets enema when necessary daily.  MiraLAX 17 g in 8 ounces of water daily.  Prilosec 20 mg daily.  Sulfasalazine 5 mg 3 times a day.  Systane eyedrops when necessary.  Trazodone 50 mg daily at bedtime when necessary insomnia .  Review of systems-stated in history of present illness again largely un attainable secondary to severe dementia --please see history of present illness--     Physical exam    Temperature 98.1 pulse 78 respirations 18 blood pressure 132/69  weight is 160 which is down about 5 pounds since the beginning of the month  .  General this is a frail but fairly  elderly female in no distress-- t appears to be quite weak somewhat bent over in her wheelchair .  Her skin is warm and dry-she does appear to have numerous seb keratoses which is baseline--   -  Eyes pupils equal round reactive to light sclera and conjunctiva areclear--visual acuity appears grossly intact.      Oropharynx somewhat limited exam since patient did not open her mouth   .  Chest -- clear to auscultation without  over rhonchi rales or wheezes no labored breathing. poor respiratory effort  makes exam somewhat  difficult-does not really follow verbal commands   Heart is regular rate and rhythm without murmur gallop or rub--trace lower extremity edema .  Abdomen soft nontender positive bowel sounds .  Muscle skeletal-do not note any deformities-- But she is quite weak .  Neurologic as she does have severe dementia-cranial nerves appear grossly intact-I do not see any lateralizing findings. Although she is not following verbal instructions and this is somewhat difficult to fully assess Psych-again she does have severe dementia-has difficulty following verbal commands. Does not really make eye contact or speak at this time   Usually she is smiling but does not really attempt this today-  .  Labs 11/19/2014.  Sodium 163 potassium 3.5 BUN 45 creatinine 1.5 to.  Liver function tests within normal limits.  WBC 15.8 hemoglobin 13.3 platelets 257    05/28/2015.  Sodium 140 potassium 3.8 BUN 13 creatinine 0.89.  Liver function tests within normal limits except ALT of 10.  WBC 8.6 hemoglobin 13.2 platelets 234   6/6 2016.  Sodium 140 potassium 3.9 BUN 16 creatinine 0.95.  WBC 6.3 hemoglobin 11.6 platelets 248   12/15/2014.  WBC 8.6 hemoglobin 12.5 platelets 225.  Sodium 142 potassium 4 BUN 19 creatinine 0.95 CO2 23  07/07/2014.  WBC 6.7 hemoglobin 12.7 platelets 257.  Sodium 141 potassium 4 BUN 15 creatinine 0.94-liver function tests within normal limits.      Assessment and plan.  -   e            .

## 2015-11-26 ENCOUNTER — Encounter: Payer: Self-pay | Admitting: Internal Medicine

## 2015-11-26 ENCOUNTER — Non-Acute Institutional Stay (SKILLED_NURSING_FACILITY): Payer: Medicare Other | Admitting: Internal Medicine

## 2015-11-26 DIAGNOSIS — E870 Hyperosmolality and hypernatremia: Secondary | ICD-10-CM

## 2015-11-26 DIAGNOSIS — N390 Urinary tract infection, site not specified: Secondary | ICD-10-CM

## 2015-11-26 DIAGNOSIS — N179 Acute kidney failure, unspecified: Secondary | ICD-10-CM | POA: Diagnosis not present

## 2015-11-26 NOTE — Assessment & Plan Note (Signed)
Monitor clinically for dehydration such as skin tenting,tachycardia, hypotension

## 2015-11-26 NOTE — Assessment & Plan Note (Signed)
[facility] Past history of penicillin allergy; she's had no adverse effects with the Keflex

## 2015-11-26 NOTE — Assessment & Plan Note (Signed)
11/26/15 Creatinine back to baseline; no change indicated

## 2015-11-26 NOTE — Progress Notes (Signed)
Patient ID: Theresa Ford, female   DOB: Jun 28, 1927, 82 y.o.   MRN: [id number]    This is a readmission to [facility] personally performed by Marga Melnick MD on this date less than 30 days from date of readmission. NWG:NFAO on file;permanent resident of [facility] HPI: She was hospitalized 3/23-03/30/2017 with profound hyponatremia with sodium up to 165. This was attributed to dehydration. This was associated with an elevation of the creatinine from a baseline of 0.9 up to 1.52. She was also found to have a Proteus urinary tract infection which she was prescribed Keflex. She has a history of allergy to penicillin but has had no adverse reaction with this agent to date. Additionally she's had constipation; radiographically she had fecal impaction. She actually has a history of ulcerative colitis and remains on Azulfidine.  Past medical and surgical history: She does have progressive Alzheimer's disease associated with discoordination. A year ago she was able to ambulate with help of her husband. Over the last several months a lift was required with support of 2 people for any ambulation. She has become more and more uncommunicative  Diagnoses include hypertension and reflux without esophagitis.  Social history: Surgical history updated as no entries are noted.  Family history: Family history updated as well as is no information on the chart.  Comprehensive review of systems: Her husband states that intermittently she does have matting and discharge from her eyes requiring antibiotics.  Constitutional: No fever, chills, significant weight change, fatigue or night sweats   Cardiovascular: No chest pain, palpitations, racing, irregular rhythm, syncope, claudication or edema  Respiratory: No cough, sputum production,hemoptysis,  dyspnea, paroxysmal nocturnal dyspnea, pleuritic chest pain, significant snoring or  apnea    Gastrointestinal: No heartburn,dysphagia, nausea and vomiting,abdominal pain,  diarrhea, rectal bleeding or melena Genitourinary: No dysuria,hematuria, pyuria, frequency, urgency,  incontinence, nocturia, or dark urine  Musculoskeletal: No myalgias or muscle cramping, joint stiffness, joint swelling, joint color change, weakness Dermatologic: No rash, pruritus, urticaria, or change in color or temperature of skin Neurologic: No headache, vertigo, limb weakness, tremor, gait disturbance, seizures, memory loss, numbness or tingling Psychiatric: No significant anxiety or depression, panic attacks, insomnia, or anorexia Endocrine: No  excessive thirst, excessive hunger, excessive urination or unexplained fatigue Hematologic/lymphatic: No bruising, lymphadenopathy or  abnormal clotting Allergy/immunology: No itchy/ watery eyes, significant sneezing, rhinitis, urticaria or angioedema  Physical exam:  Pertinent or positive findings: The patient lies in a reclining chair coming with her eyes and mouth closed. She resisted examination. There appears to be some matting of the right eye. According to her husband she has a few remaining anterior teeth  She has significant DIP osteoarthritic changes. She has scattered flexion changes in the hands crepitus of knees. Posterior tibial pulses are decreased. General appearance:Adequately nourished; no acute distress or increased work of breathing is present.   Lymphatic: No  lymphadenopathy about the head, neck, or axilla . Ears:  External ear exam shows no significant lesions or deformities.   Nose:  External nasal examination shows no deformity or inflammation. Nasal mucosa are pink and moist without lesions or exudates No septal dislocation or deviation.No obstruction to airflow.  Neck:  No deformities, thyromegaly, masses, or tenderness noted.   Heart:  Normal rate and regular rhythm. S1 and S2 normal without gallop, murmur, click, rub or other extra sounds.  Lungs:Chest clear to auscultation; no wheezes, rhonchi,rales ,or rubs  present. Abdomen:Bowel sounds are normal. Abdomen is soft and nontender with no organomegaly, hernias  or  masses. GU: deferred as previously addressed. Extremities:  No cyanosis, edema, or clubbing noted. Strength equal & grossly normal in upper & lower extremities Deep tendon reflexes are normal. Skin: Warm & dry w/o tenting or jaundice. No significant lesions or rash.  See summary under each active problem in the Problem List with associated updated therapeutic plan

## 2015-11-26 NOTE — Patient Instructions (Signed)
See summary under each active problem in the Problem List with associated updated therapeutic plan. Completed document transmitted to [facility]. 

## 2015-11-27 ENCOUNTER — Encounter (HOSPITAL_COMMUNITY)
Admission: RE | Admit: 2015-11-27 | Discharge: 2015-11-27 | Disposition: A | Discharge status: Home / Self Care | Payer: Medicare Other | Source: Skilled Nursing Facility | Attending: Internal Medicine | Admitting: Internal Medicine

## 2015-11-27 DIAGNOSIS — L03114 Cellulitis of left upper limb: Secondary | ICD-10-CM | POA: Diagnosis not present

## 2015-11-27 DIAGNOSIS — I10 Essential (primary) hypertension: Secondary | ICD-10-CM | POA: Diagnosis present

## 2015-11-27 DIAGNOSIS — E870 Hyperosmolality and hypernatremia: Secondary | ICD-10-CM | POA: Diagnosis not present

## 2015-11-27 DIAGNOSIS — F0280 Dementia in other diseases classified elsewhere without behavioral disturbance: Secondary | ICD-10-CM | POA: Diagnosis not present

## 2015-11-27 LAB — CBC
HCT: 38.1 % (ref 36.0–46.0)
HEMOGLOBIN: 12.1 g/dL (ref 12.0–15.0)
MCH: 30 pg (ref 26.0–34.0)
MCHC: 31.8 g/dL (ref 30.0–36.0)
MCV: 94.5 fL (ref 78.0–100.0)
Platelets: 300 10*3/uL (ref 150–400)
RBC: 4.03 MIL/uL (ref 3.87–5.11)
RDW: 15.4 % (ref 11.5–15.5)
WBC: 9.2 10*3/uL (ref 4.0–10.5)

## 2015-11-27 LAB — BASIC METABOLIC PANEL
ANION GAP: 8 (ref 5–15)
BUN: 22 mg/dL — AB (ref 6–20)
CALCIUM: 9.2 mg/dL (ref 8.9–10.3)
CO2: 22 mmol/L (ref 22–32)
Chloride: 112 mmol/L — ABNORMAL HIGH (ref 101–111)
Creatinine, Ser: 0.86 mg/dL (ref 0.44–1.00)
GFR calc Af Amer: >60 mL/min (ref >60)
GFR calc non Af Amer: 59 mL/min — ABNORMAL LOW (ref >60)
GLUCOSE: 108 mg/dL — AB (ref 65–99)
Potassium: 4.1 mmol/L (ref 3.5–5.1)
Sodium: 142 mmol/L (ref 135–145)

## 2015-11-28 DEATH — deceased

## 2015-12-01 ENCOUNTER — Encounter: Payer: Self-pay | Admitting: Internal Medicine

## 2015-12-01 ENCOUNTER — Non-Acute Institutional Stay (SKILLED_NURSING_FACILITY): Payer: Medicare Other | Admitting: Internal Medicine

## 2015-12-01 DIAGNOSIS — G309 Alzheimer's disease, unspecified: Secondary | ICD-10-CM

## 2015-12-01 DIAGNOSIS — F0280 Dementia in other diseases classified elsewhere without behavioral disturbance: Secondary | ICD-10-CM

## 2015-12-01 DIAGNOSIS — R509 Fever, unspecified: Secondary | ICD-10-CM | POA: Diagnosis not present

## 2015-12-01 NOTE — Progress Notes (Signed)
Patient ID: Keturah BarreBettie Ticas, female   DOB: 22-Oct-1926, 82 y.o.   MRN: [id number]              this is an acute visit Level of care skilled.  [facility]   Chief complaint --acute visit follow-up fever of unknown origin .  History of present illness.  Patient is a pleasant elderly resident with a history of significant dementia  She has been quite stable with supportive care for an extended period of time however recently has significantly declined.  She had poor by mouth intake and and has started some behaviors like fairly constant humming at time.  Her husband has desired conservative interventions with minimal lab draws or radiology studies.  Late last month labs were drawn and showed a significantly elevated sodium of 165-family was consulted and the decision was made to go to the hospital-she was directly admitted where she was hydrated she was also found to have a UTI and treated with Keflex.  She has since returned to the facility and appeared to be doing fairly well relatively speaking although with her progressing dementia by mouth intake continued be a challenge.  This weekend she did spike a temperature of 101.3-family did not want to send her to the ER or order lab work at that point.  Her antibiotic was switched from Keflex to Rocephin since this is an IM form-there was some thought possibly she wasn't getting full doses a Keflex secondary to relatively poor by mouth intake.  Nonetheless she is no longer febrile appears to be at her baseline actually ate  I would say 50-60% of her lunch this noon and at continues with significant dementia but does not appear to be in any distress or give a septic presentation.      Previous medical history.  Alzheimer's dementia  Failure to thrive.  Hypernatremia.  UTI.  Equal tactile.  History of ulcerative colitis.  History of conjunctivitis.  History of leukocytosis resolved.   acute kidney injury  improved.  Social history-patient is been a long-term resident of this facility she does have 2 sons both who are physicians in town-and [place]-she has a husband who is very supportive.  No apparent history of significant tobacco alcohol or illicit drug use.  Family history currently unobtainable.  Medications.  Bisacodyl suppository when necessary daily.  Keflex 5 mg twice a day until 11/30/2015.  Claritin 10 mg by mouth daily.  Colace 100 mg twice a day.  Fleets enema daily when necessary.  Folic acid 1 mg daily when necessary Atrovent nebs every 6 hours when necessary.  MiraLAX daily.  Prilosec 20 mg daily.  Sorbitol 30 mL when necessary daily.  Trazodone 50 mg daily at bedtime when necessary.  Sulfasalazine 1000 mg TID  Review of systems essentially unattainable please see history of present illness.  Physical exam.  She is no longer febrile and got a pulse of 86 respirations 18 last blood pressure 100/54  In general this is a frail elderly female in no distress  She actually opens her eyes today appears to make eye contact although has significant dementia. She is actually eating fairly well at lunch  Eyes did not appreciate any overt exudate or erythematous conjunctiva she is completing a course of gentamicin for conjunctivitis  Skin is warm and dry she has numerous seborrheic keratosis.  Eyes as noted above-.   Chest poor respiratory effort but no overt congestion or labored breathing noted.  Heart  distant heart sounds regular rate and rhythm without significant lower extremity edema.  Abdomen soft protuberant soft with active  bowel sounds does not appear to be overtly tender to palpation.  Musculoskeletal does move all extremities 4 does not really follow verbal prompts however.  Neurologic appears able to move all extremities 4 I could not really see any lateralizing findings again this was difficult secondary to  patient not following verbal commands.  Psych compatible with significant dementi up in chair her eyes are open today she actually is eating   Labs.  11/27/2015.  Sodium 142 potassium 4.1 BUN 22 creatinine 0.86  11/13/2015.  Sodium 143 potassium 3.6 BUN 12 creatinine 0.86.  11/23/2015.  WBC 7.6 hemoglobin 11.1 platelets 196.  Assessment and plan.  #1-history of failure to thrive with hypernatremia-I suspect this will continue to be a challenge with poor by mouth intake again fluids and by mouth intake are strongly encourage her husband who is very supportive has been involved in this as well-at this point will monitor at some point consider palliative care consult if status does not improve.  At this point patient's husband defers follow-up labs but-will revaluate in a couple days.  #2 UTI-she is completing a course of Keflex for Proteus-she is currently afebrile.  #3 history of acute kidney injury creatinine is back close to baseline at 0.86-again this will continue to be a challenge of fair by mouth intake does not improve.  #4 history of fecal impaction and as noted above she is on numerous bowel agents at this point will monitor.  #5- leukocytosis-this appears resolved with antibiotic treatment again there is questionable follow-up labs-again will defer to family wishes.  #6 history of conjunctivitis her husband who is quite attentive she has some exudate again in both eyes will treat with gentamicin eyedrops which have been effective in the past we'll do a seven-day course of this.  #7 history of ulcerative colitis she continues on Sulfasalazine this is been stable for an extended period of time.  Number a history of folic acid deficiency she was thought to be deficient in the hospital she is now on folic acid.  [id number] note greater than 40 minutes spent assessing patient-discussing her status with her husband-and  coordinating and formulating a plan of care for  numerous diagnoses-of note greater than 50% of time spent coordinating plan of care with family input    .         -    "  .  Family medical social history has been reviewed per history and physical on 08/24/2011  .  Medications reviewed per [facility]  They include.  Benadryl 25 mg daily at bedtime when necessary.  Claritin 10 mg daily.  Colace 100 mg twice a day.  Dulcolax tablet when necessary.  Fleets enema when necessary daily.  MiraLAX 17 g in 8 ounces of water daily.  Prilosec 20 mg daily.  Sulfasalazine 5 mg 3 times a day.  Systane eyedrops when necessary.  Trazodone 50 mg daily at bedtime when necessary insomnia .  Review of systems-stated in history of present illness again largely un attainable secondary to severe dementia --please see history of present illness--     Physical exam    Temperature 98.1 pulse 78 respirations 18 blood pressure 132/69 weight is 160 which is down about 5 pounds since the beginning of the month  .  General this is a frail but fairly  elderly female in no distress-- t appears to be  quite weak somewhat bent over in her wheelchair .  Her skin is warm and dry-she does appear to have numerous seb keratoses which is baseline--   -  Eyes pupils equal round reactive to light sclera and conjunctiva areclear--visual acuity appears grossly intact.      Oropharynx somewhat limited exam since patient did not open her mouth   .  Chest -- clear to auscultation without  over rhonchi rales or wheezes no labored breathing. poor respiratory effort  makes exam somewhat difficult-does not really follow verbal commands   Heart is regular rate and rhythm without murmur gallop or rub--trace lower extremity edema .  Abdomen soft nontender positive bowel sounds .  Muscle skeletal-do not note any deformities-- But she is quite weak .  Neurologic as she does have severe dementia-cranial nerves appear grossly  intact-I do not see any lateralizing findings. Although she is not following verbal instructions and this is somewhat difficult to fully assess Psych-again she does have severe dementia-has difficulty following verbal commands. Does not really make eye contact or speak at this time   Usually she is smiling but does not really attempt this today-  .  Labs 11/19/2014.  Sodium 163 potassium 3.5 BUN 45 creatinine 1.5 to.  Liver function tests within normal limits.  WBC 15.8 hemoglobin 13.3 platelets 257    05/28/2015.  Sodium 140 potassium 3.8 BUN 13 creatinine 0.89.  Liver function tests within normal limits except ALT of 10.  WBC 8.6 hemoglobin 13.2 platelets 234   6/6 2016.  Sodium 140 potassium 3.9 BUN 16 creatinine 0.95.  WBC 6.3 hemoglobin 11.6 platelets 248   12/15/2014.  WBC 8.6 hemoglobin 12.5 platelets 225.  Sodium 142 potassium 4 BUN 19 creatinine 0.95 CO2 23  07/07/2014.  WBC 6.7 hemoglobin 12.7 platelets 257.  Sodium 141 potassium 4 BUN 15 creatinine 0.94-liver function tests within normal limits.      Assessment and plan.  -  Fever of unknown origin-she was treated for Proteus UTI in the hospital discharged on Keflex-she did spike a temperature over the weekend she has been switched to Rocephin to complete a course on April 8 which would be 7 days-currently afebrile appears actually to be at her baseline she actually is eating a bit better apparently drinking a bit better-I did have an extensive discussion with her husband-at this point will defer follow-up labs again family is quite conservative in this regards-clinically she appears to be doing relatively well although again at risk for recurrence of failure to thrive certainly with her history of progressing dementia and family  understands this.  [id number] note greater than 25 minutes spent assessing patient-discussing his status with her husband-reviewing her labs and chart-and coordinating  plan of care with husband's input-of note greater than 50% of time spent coordinating plan of care with discussion with husband            .

## 2015-12-31 ENCOUNTER — Encounter: Payer: Self-pay | Admitting: Internal Medicine

## 2015-12-31 ENCOUNTER — Non-Acute Institutional Stay (SKILLED_NURSING_FACILITY): Payer: Medicare Other | Admitting: Internal Medicine

## 2015-12-31 ENCOUNTER — Encounter (HOSPITAL_COMMUNITY)
Admission: RE | Admit: 2015-12-31 | Discharge: 2015-12-31 | Disposition: A | Discharge status: Home / Self Care | Payer: Medicare Other | Source: Skilled Nursing Facility | Attending: Internal Medicine | Admitting: Internal Medicine

## 2015-12-31 DIAGNOSIS — F0280 Dementia in other diseases classified elsewhere without behavioral disturbance: Secondary | ICD-10-CM | POA: Insufficient documentation

## 2015-12-31 DIAGNOSIS — I10 Essential (primary) hypertension: Secondary | ICD-10-CM | POA: Diagnosis not present

## 2015-12-31 DIAGNOSIS — R627 Adult failure to thrive: Secondary | ICD-10-CM

## 2015-12-31 DIAGNOSIS — G43A0 Cyclical vomiting, not intractable: Secondary | ICD-10-CM | POA: Diagnosis not present

## 2015-12-31 DIAGNOSIS — E870 Hyperosmolality and hypernatremia: Secondary | ICD-10-CM | POA: Diagnosis present

## 2015-12-31 DIAGNOSIS — G309 Alzheimer's disease, unspecified: Secondary | ICD-10-CM | POA: Diagnosis not present

## 2015-12-31 DIAGNOSIS — H109 Unspecified conjunctivitis: Secondary | ICD-10-CM

## 2015-12-31 DIAGNOSIS — K219 Gastro-esophageal reflux disease without esophagitis: Secondary | ICD-10-CM | POA: Insufficient documentation

## 2015-12-31 DIAGNOSIS — M1990 Unspecified osteoarthritis, unspecified site: Secondary | ICD-10-CM | POA: Insufficient documentation

## 2015-12-31 DIAGNOSIS — R1115 Cyclical vomiting syndrome unrelated to migraine: Secondary | ICD-10-CM

## 2015-12-31 LAB — URINALYSIS, ROUTINE W REFLEX MICROSCOPIC
BILIRUBIN URINE: NEGATIVE
Glucose, UA: NEGATIVE mg/dL
HGB URINE DIPSTICK: NEGATIVE
KETONES UR: NEGATIVE mg/dL
NITRITE: NEGATIVE
Specific Gravity, Urine: 1.025 (ref 1.005–1.030)
pH: 5 (ref 5.0–8.0)

## 2015-12-31 LAB — URINE MICROSCOPIC-ADD ON

## 2015-12-31 NOTE — Progress Notes (Signed)
Location:  [facility]   Place of Service:  SNF [phone])  Marga Melnick, MD  Patient Care Team: Pecola Lawless, MD as PCP - General (Internal Medicine)  Extended Emergency Contact Information Primary Emergency Contact: Leonia Reeves [place] Home Phone: [phone] Mobile Phone: [phone] Relation: Spouse   Goals of care: Advanced Directive information Advanced Directives 12/31/2015  Does patient have an advance directive? Yes  Type of Advance Directive Out of facility DNR (pink MOST or yellow form)  Does patient want to make changes to advanced directive? No - Patient declined  Copy of advanced directive(s) in chart? Yes     Chief Complaint  Patient presents with  . Acute Visit   Secondary to conjunctivitis-episode of vomiting yesterday HPI:  Pt is a 82 y.o. female seen today for an acute visit for follow-up of conjunctivitis as well as a vomiting episode yesterday.  Patient is a long-term resident of this facility with a history of end-stage dementia.  She was hospitalized back in March 4 hypernatremia with some poor by mouth intake sodium was up to 165.  She was hospitalized her she was hydrated she was also found to have a UTI and treated with Keflex.  She is been relatively stable since return to the facility with emphasis again basically on conservative interventions.  Apparently she did have a vomiting episode yesterday-she has been placed on a clear liquid diet.  Today apparently she is tolerating this diet well per chart review it appears she did eat a partially [employer] lunch.  She has received Phenergan but apparently had lethargy this morning and her husband was wondering if she could be tried on a different agent which certainly would make sense.  She also has developed again conjunctivitis of her eyes bilaterally right greater than left she has reacted well to a course of gentamicin in the past  Patient has significant  dementia and cannot really give any review of systems but today she appears to be relatively at her baseline lying in bed comfortably when I examined her this afternoon.     Past Medical History  Diagnosis Date  . Dementia associated with other underlying disease without behavioral disturbance   . Muscle weakness (generalized)   . Lack of coordination   . Urinary tract infection, site not specified 10/2015    Proteus UTI  . Hyperosmolality and hypernatremia     due to dehydration  . Senile arthritis   . Hypertension, essential   . Gastroesophageal reflux disease without esophagitis   . Ulcerative colitis, unspecified, without complications [facility])    Past Surgical History  Procedure Laterality Date  . Tonsillectomy    . G3p3    . Bladder prolapse surgery  2002    Allergies  Allergen Reactions  . Penicillins     Unknown reaction-Listed on [facility]  Hospitalized 3/23-03/03/2016 she tolerated Keflex for Proteus urinary tract infection without reaction    Current Outpatient Prescriptions on File Prior to Visit  Medication Sig Dispense Refill  . bisacodyl (DULCOLAX) 10 MG suppository Place 10 mg rectally at bedtime as needed for moderate constipation.    . docusate (COLACE) 50 MG/5ML liquid Take 100 mg by mouth 2 (two) times daily. Dilute in 6 oz to 8 oz of juice of milk    . folic acid (FOLVITE) 1 MG tablet Take 1 tablet (1 mg total) by mouth daily.    . IPRATROPIUM BROMIDE IN 2.57ml inhalation every 6 hours as needed for wheezing,  SOB, Congestion    . loratadine (CLARITIN) 10 MG tablet Take 10 mg by mouth daily.    Marland Kitchen. omeprazole (PRILOSEC) 20 MG capsule Take 20 mg by mouth every evening.     . polyethylene glycol (MIRALAX / GLYCOLAX) packet Take 17 g by mouth daily.    Marland Kitchen. Propylene Glycol (SYSTANE BALANCE) 0.6 % SOLN Apply to eye 2 (two) times daily as needed (For dry eyes).    . Sodium Phosphates (FLEET ENEMA RE) Place 133 mLs rectally as needed (For Constipation).    . sorbitol 70 %  solution Take 30 mLs by mouth daily as needed.    . sulfaSALAzine (AZULFIDINE) 500 MG tablet Take 1,000 mg by mouth 3 (three) times daily.    . traZODone (DESYREL) 50 MG tablet Take 50 mg by mouth at bedtime as needed for sleep.     No current facility-administered medications on file prior to visit.     Review of Systems   Essentially unattainable secondary to dementia please see history of present illness  Immunization History  Administered Date(s) Administered  . Influenza-Unspecified 06/04/2014  . PPD Test 08/22/2014   Pertinent  Health Maintenance Due  Topic Date Due  . DEXA SCAN  03/26/1994  . PNA vac Low Risk Adult (2 of 2 - PPSV23) 04/29/2012  . INFLUENZA VACCINE  03/29/2016   No flowsheet data found. Functional Status Survey:    Filed Vitals:   12/31/15 1608  BP: 140/68  Pulse: 74  Temp: 98.6 F (37 C)  TempSrc: Oral  Resp: 18  Weight: 165 lb 8 oz (75.07 kg)   Body mass index is 27.54 kg/(m^2). Physical Exam   In general this is a somewhat frail elderly female in no distress lying comfortably in bed.  Her skin is warm and dry.  Eyes she does appear to have some mild erythema of her eyes bilaterally with some exudate most prominent on the right eyelid margins-this is somewhat difficult secondary to patient closing her eyes with any attempt an invasive examination.  Chest she had poor respiratory effort but could not appreciate any congestion no signs of labored breathing.  Heart is regular rate and rhythm without murmur gallop or rub she does not have significant lower extremity edema.  Abdomen continues to be soft protuberant with slightly hypoactive bowel sounds there does not appear to be any acute tenderness to palpation.  Muscle skeletal she is able to move all her extremities 4 is not really following verbal prompts.  Neurologic appears grossly intact is able to move all extremities I cannot really see lateralizing findings cranial nerves appear to  be grossly intact.  Psych she does have significant dementia does not follow verbal commands but appears to be essentially at her baseline  Labs reviewed:  Recent Labs  11/19/15 1739  11/20/15 0515  11/23/15 0534 11/10/2015 0525 11/27/15 0720  NA 160*  < > 158*  < > 144 143 142  K 3.3*  < > 4.4  < > 5.0 3.6 4.1  CL 128*  < > 129*  < > 116* 113* 112*  CO2 25  < > 20*  < > 22 22 22   GLUCOSE 103*  < > 113*  < > 101* 125* 108*  BUN 42*  < > 35*  < > 14 12 22*  CREATININE 1.37*  < > 1.29*  < > 1.01* 0.86 0.86  CALCIUM 8.9  < > 8.9  < > 8.9 8.7* 9.2  MG 2.9*  --  3.3*  --   --   --   --   < > = values in this interval not displayed.  Recent Labs  05/28/15 0750 11/19/15 1116  AST 19 21  ALT 10* 14  ALKPHOS 88 88  BILITOT 0.5 0.7  PROT 7.5 7.7  ALBUMIN 3.9 3.7    Recent Labs  11/19/15 1116 11/20/15 1005 11/21/15 0554 11/22/15 0851 11/23/15 0534 11/27/15 0720  WBC 15.8* 9.2 12.3* 7.4 7.6 9.2  NEUTROABS 11.8* 6.3 8.2*  --   --   --   HGB 13.3 7.9* 11.8* 10.9* 11.1* 12.1  HCT 43.7 33.0* 39.0 35.0* 35.3* 38.1  MCV 100.0 99.4 97.3 95.1 93.9 94.5  PLT 257 117* 229 203 196 300   Lab Results  Component Value Date   TSH 2.308 11/19/2015      Assessment/Plan  #1 vomiting episode yesterday-apparently there's been no reoccurrence-she has been treated with Phenergan apparently there was some a.m. sedation lethargy concerns-will discontinue the Phenergan and start Zofran 4 mg every 6 hours when necessary and monitor-she appears to be at her baseline today is alert with signs of severe dementia but I did not really see lethargy this afternoon.  #2 history of conjunctivitis appears to be a recurrent she has responded well in the past 2 and extended course of gentamicin which we will will restart for a 10 day course  #3 history of failure to thrive hypernatremia complicated with history of severe dementia-apparently her appetite is improved somewhat-family desires conservative  follow-up with labs at this point will monitor clinically she appears to be essentially at her baseline today per exam with spotty by mouth intake --although appears she takes her supplements well-weight appears to be relatively stable at 165.6.  #4 history of ulcerative colitis she continues on sulfasalazine this is been stable for an extended period of time.  #5 history constipation she is on MiraLAX as well as Colace twice a day also Dulcolax suppository when necessary apparently she is having fairly regular bowel movements although this will have to be monitored closely-this has been an issue in the past have to be watched.  [id number]

## 2015-12-31 NOTE — Progress Notes (Signed)
Patient ID: Theresa Ford, female   DOB: [birth year], 82 y.o.   MRN: [id number]

## 2016-01-01 ENCOUNTER — Encounter (HOSPITAL_COMMUNITY): Admission: RE | Admit: 2016-01-01 | Payer: Medicare Other | Source: Ambulatory Visit

## 2016-01-02 LAB — URINE CULTURE: Culture: 30000 — AB

## 2016-01-05 ENCOUNTER — Non-Acute Institutional Stay (SKILLED_NURSING_FACILITY): Payer: Medicare Other | Admitting: Internal Medicine

## 2016-01-05 ENCOUNTER — Encounter: Payer: Self-pay | Admitting: Internal Medicine

## 2016-01-05 DIAGNOSIS — F0280 Dementia in other diseases classified elsewhere without behavioral disturbance: Secondary | ICD-10-CM | POA: Diagnosis not present

## 2016-01-05 DIAGNOSIS — R509 Fever, unspecified: Secondary | ICD-10-CM

## 2016-01-05 DIAGNOSIS — H109 Unspecified conjunctivitis: Secondary | ICD-10-CM

## 2016-01-05 DIAGNOSIS — G309 Alzheimer's disease, unspecified: Secondary | ICD-10-CM

## 2016-01-05 NOTE — Progress Notes (Signed)
Location:  [facility] Room Number: 151/P Place of Service:  SNF [phone]) Provider:  Abigail Miyamoto, MD  Patient Care Team: Pecola Lawless, MD as PCP - General (Internal Medicine)  Extended Emergency Contact Information Primary Emergency Contact: Leonia Reeves [place] Home Phone: [phone] Mobile Phone: [phone] Relation: Spouse  Code Status:  DNR/MOST  Goals of care: Advanced Directive information Advanced Directives 01/05/2016  Does patient have an advance directive? Yes  Type of Advance Directive Out of facility DNR (pink MOST or yellow form)  Does patient want to make changes to advanced directive? No - Patient declined  Copy of advanced directive(s) in chart? Yes     Chief Complaint  Patient presents with  . Acute Visit    Patients c/o Has fever    HPI:  Pt is a 82 y.o. female seen today for an acute visit for Follow-up of fever late last week.  Patient apparently developed a temperature of 103 on Thursday night-her family desires conservative treatment essentially comfort.  I was called about this and we did start Rocephin since this has been effective in the past for her UTIs-most recently Proteus-.  Since then she has been afebrile appears to be doing better still has spotty by mouth intake but I suspect this secondary to her progressive dementia  I have reviewed the urine culture now she has only grown out 30,000 colonies of lactobacillus.  Family desires conservative care so a chest x-ray was not ordered one would wonder possibly if there may have been some pneumonia etiology which contributed to the spike temperature.  Nonetheless with the antibiotic she appears to have improved clinically   She also is a course of gentamicin for recurrent conjunctivitis and this also appears to be improving  .   Past Medical History  Diagnosis Date  . Dementia associated with other underlying disease without  behavioral disturbance   . Muscle weakness (generalized)   . Lack of coordination   . Urinary tract infection, site not specified 10/2015    Proteus UTI  . Hyperosmolality and hypernatremia     due to dehydration  . Senile arthritis   . Hypertension, essential   . Gastroesophageal reflux disease without esophagitis   . Ulcerative colitis, unspecified, without complications [facility])    Past Surgical History  Procedure Laterality Date  . Tonsillectomy    . G3p3    . Bladder prolapse surgery  2002    Allergies  Allergen Reactions  . Penicillins     Unknown reaction-Listed on [facility]  Hospitalized 3/23-2016/06/15 she tolerated Keflex for Proteus urinary tract infection without reaction   Medications.  Ativan 0.25 mg every 6 hours when necessary.  Bisacodyl 10 mg when necessary daily.  Rocephin 1 g IM daily until May 11.  Claritin 10 mg daily.  Colace 100 mg twice a day.  Fleets enema daily when necessary.  4*250 mg twice a day until May 18.  Folic acid 1 mg daily.  Gentamicin 0.3% eyedrops 2 drops both eyes 3 times a day through May 14.  Ipratropium bromide nebulizers every 6 hours when necessary.  MiraLAX daily.  Prilosec 20 mg daily.  Robitussin DM 10-200 milligrams-5 mL-every 6 hours when necessary cough.  Sorbitol 70% when necessary daily  Sulfasalazine 500 mg 3 times a day  Sustain balance eyedrops when necessary twice a day.  Trazodone 50 mg daily at bedtime when necessary.  Zofran 4 mg every 6 hours when necessary.  Tylenol 6 and 50 mg every 4 hours when necessary pain or fever   Review of Systems    essentially unattainable secondary to severe dementia she has been afebrile apparently is eating and drinking some although this continues to be a challenge no fever chills has been noted or acute pain  Immunization History  Administered Date(s) Administered  . Influenza-Unspecified 06/04/2014  . PPD Test 08/22/2014   Pertinent  Health Maintenance Due    Topic Date Due  . PNA vac Low Risk Adult (2 of 2 - PPSV23) 08/29/2016 (Originally 04/29/2012)  . DEXA SCAN  08/30/2023 (Originally 03/26/1994)  . INFLUENZA VACCINE  03/29/2016   No flowsheet data found. Functional Status Survey:    Filed Vitals:   01/05/16 1532  BP: 132/80  Pulse: 86  Temp: 99.3 F (37.4 C)  TempSrc: Oral  Resp: 20  Height: 5\' 5"  (1.651 m)  Weight: 162 lb 12.8 oz (73.846 kg)   Body mass index is 27.09 kg/(m^2). Physical Exam   In general this is a frail elderly female in no distress.  Her skin is warm and dry she has numerous seborrheic keratosis.  Eyes per her husband exudate has improved she is holding her eyes closed this evening-from what I can see a do not see any exudate.  Chest is clear to auscultation with poor respiratory effort no labored breathing.  Heart is regular rate and rhythm without murmur gallop or rub.  Abdomen is soft somewhat protuberant nontender with positive bowel sounds.  Muscle skeletal does move all extremities 4 baseline.  Neurologic she does have severe dementia I do not see any lateralizing findings cranial nerves appear grossly intact.  Psych again severe dementia.    Labs reviewed:  Recent Labs  11/19/15 1739  11/20/15 0515  11/23/15 0534 11/01/2015 0525 11/27/15 0720  NA 160*  < > 158*  < > 144 143 142  K 3.3*  < > 4.4  < > 5.0 3.6 4.1  CL 128*  < > 129*  < > 116* 113* 112*  CO2 25  < > 20*  < > 22 22 22   GLUCOSE 103*  < > 113*  < > 101* 125* 108*  BUN 42*  < > 35*  < > 14 12 22*  CREATININE 1.37*  < > 1.29*  < > 1.01* 0.86 0.86  CALCIUM 8.9  < > 8.9  < > 8.9 8.7* 9.2  MG 2.9*  --  3.3*  --   --   --   --   < > = values in this interval not displayed.  Recent Labs  05/28/15 0750 11/19/15 1116  AST 19 21  ALT 10* 14  ALKPHOS 88 88  BILITOT 0.5 0.7  PROT 7.5 7.7  ALBUMIN 3.9 3.7    Recent Labs  11/19/15 1116 11/20/15 1005 11/21/15 0554 11/22/15 0851 11/23/15 0534 11/27/15 0720  WBC 15.8*  9.2 12.3* 7.4 7.6 9.2  NEUTROABS 11.8* 6.3 8.2*  --   --   --   HGB 13.3 7.9* 11.8* 10.9* 11.1* 12.1  HCT 43.7 33.0* 39.0 35.0* 35.3* 38.1  MCV 100.0 99.4 97.3 95.1 93.9 94.5  PLT 257 117* 229 203 196 300   Lab Results  Component Value Date   TSH 2.308 11/19/2015   No results found for: HGBA1C No results found for: CHOL, HDL, LDLCALC, LDLDIRECT, TRIG, CHOLHDL  Significant Diagnostic Results in last 30 days:  No results found.  Assessment/Plan #1 fever of unknown origin-this appears  to have resolved-she is on Rocephin and this was an empiric choice with her history of UTIs this appears to be effective she has been afebrile appears to be close to her baseline although she continues to decline with a history of progressing dementia with limited by mouth intake-at this point appears to be stabilized however I do not see signs of sepsis will monitor she is completing a course of Rocephin as well as a probiotic.  #2 conjunctivitis again she is completing a course of gentamicin this appears to be resolving as well.  [id number].     London Sheer, [place] [phone]

## 2016-01-22 ENCOUNTER — Non-Acute Institutional Stay (SKILLED_NURSING_FACILITY): Payer: Medicare Other | Admitting: Internal Medicine

## 2016-01-22 DIAGNOSIS — R509 Fever, unspecified: Secondary | ICD-10-CM

## 2016-01-22 DIAGNOSIS — G309 Alzheimer's disease, unspecified: Secondary | ICD-10-CM

## 2016-01-22 DIAGNOSIS — H109 Unspecified conjunctivitis: Secondary | ICD-10-CM | POA: Diagnosis not present

## 2016-01-22 DIAGNOSIS — F0280 Dementia in other diseases classified elsewhere without behavioral disturbance: Secondary | ICD-10-CM | POA: Diagnosis not present

## 2016-01-22 DIAGNOSIS — R627 Adult failure to thrive: Secondary | ICD-10-CM | POA: Diagnosis not present

## 2016-01-22 NOTE — Progress Notes (Signed)
Patient ID: Theresa Ford, female   DOB: April 02, 1927, 82 y.o.   MRN: [id number]   This is an acute visit.  Level care skilled.  Facility [facility].  Chief complaint acute visit secondary to conjunctivitis-follow-up fever of unknown origin-follow-up failure to thrive.  History of present illness.  Patient is an 82 year old female who's been a long-term resident of this facility-she does have a history of significant dementia which appears to be progressing along with failure to thrive issues.  Earlier this year she was hospitalized for hyponatremia-she was treated aggressively with IV fluids area  She has return to the facility and apparently has been doing a bit better she is eating and drinking better although still a spotty at times-family wishes conservative care here with no aggressive interventions.  She did recently develop a fever of unknown origin this was thought to be most likely a UTI she has responded to Rocephin in the past she was started on Rocephin and did respond well to this she is currently afebrile.  She does have a history of recurrent bilateral conjunctivitis this usually responds to a course of gentamicin ophthalmologic solution and we will restart this she again has developed some exudate and erythema of her eyes.  Family medical social history as been reviewed per previous progress note on 12/31/2015.  Medications have been reviewed per MAR. They include.  Tylenol 650 mg every 4 hours when necessary.  Avelox suppository 10 mg daily when necessary.  Robitussin-cold and chest-5 mL every 6 hours when necessary.  Ipratropium bromide when necessary.  Foley cath should 1 mg daily.  Ativan 0.5 mg every 6 hours when necessary.  Prilosec 20 mg by mouth daily.  Claritin 10 mg by mouth daily.  Zofran 4 mg every 6 hours when necessary.  MiraLAX daily.  Sorbitol 30 mL daily when necessary.  Trazodone 50 mg daily at bedtime when necessary.  Sulfasalazine  1000 mg 3 times a day.  Colace 100 mg by mouth twice a day    Review of systems essentially unattainable secondary to dementia please see history of present illness.  Physical exam.  Temperature is 97.8 pulse 80 respirations 20 blood pressure 138/78.  In general this is a frail female in no distress sitting comfortably in her wheelchair.  Her skin is warm and dry she does have numerous seborrheic keratosis.  Eyes she does have bilateral erythema of her conjunctivae and 8 date of her lower lid margins pupils appear reactive light visual acuity does not appear to be impaired.  Oropharynx was difficult to assess secondary to patient holding her mouth shut.  Chest clear to auscultation there is no labored breathing there is poor respiratory effort.  Heart is regular rate and rhythm without murmur gallop or rub she has minimal lower extremity edema.  Abdomen protuberant soft nontender positive bowel sounds.  Musculoskeletal moves all extremities 4 at baseline it appears I do not note changes other than arthritic.  Neurologic again she does have significant dementia I do not note any lateralizing findings however cranial nerves appear to be intact she speaks very minimally.   psych again she does have severe dementia  Labs.  11/27/2015.  Sodium 142 potassium 4.1 BUN 22 creatinine 0.6.  WBC 9.2 hemoglobin 12.1 platelets 300.  Assessment plan.  Conjunctivitis-will restart gentamicin ophthalmologic solution 0.3% 2 drops in eyes bilaterally 3 times a day 10 days-will make this when necessary as well since this appears to be recurrent this was discussed with her husband who would  really like the when necessary order as well as.  #2 history of severe dementia with failure thrive she appears to be relatively stable with supportive care has a spotty appetite but clinically he appears to be doing fairly well again family wishes conservative interventions.  #3-fever of unknown origin  this appears to have resolved back on May 4 actually urine culture was not very remarkable-however she appeared to have responded nonetheless to course of Rocephin.  [id number].

## 2016-01-24 ENCOUNTER — Encounter: Payer: Self-pay | Admitting: Internal Medicine

## 2016-03-03 ENCOUNTER — Non-Acute Institutional Stay (SKILLED_NURSING_FACILITY): Payer: Medicare Other | Admitting: Internal Medicine

## 2016-03-03 ENCOUNTER — Encounter: Payer: Self-pay | Admitting: Internal Medicine

## 2016-03-03 DIAGNOSIS — H109 Unspecified conjunctivitis: Secondary | ICD-10-CM

## 2016-03-03 DIAGNOSIS — E43 Unspecified severe protein-calorie malnutrition: Secondary | ICD-10-CM

## 2016-03-03 DIAGNOSIS — G309 Alzheimer's disease, unspecified: Secondary | ICD-10-CM | POA: Diagnosis not present

## 2016-03-03 DIAGNOSIS — I10 Essential (primary) hypertension: Secondary | ICD-10-CM

## 2016-03-03 DIAGNOSIS — F0280 Dementia in other diseases classified elsewhere without behavioral disturbance: Secondary | ICD-10-CM

## 2016-03-03 NOTE — Assessment & Plan Note (Signed)
Prophylactic cleansing with baby shampoo discussed with husband

## 2016-03-03 NOTE — Assessment & Plan Note (Signed)
Continue restorative feeding protocol

## 2016-03-03 NOTE — Assessment & Plan Note (Signed)
No change in therapy indicated Husband is aware of probable progressive decline

## 2016-03-03 NOTE — Patient Instructions (Signed)
No new orders  External eye cleansing with dilute lather of baby shampoo. Make sure that all residual soap is removed to prevent irritation.

## 2016-03-03 NOTE — Progress Notes (Signed)
    Facility Location: [facility] Room Number: 151/P  This is a nursing facility follow up of chronic medical diagnoses  Interim medical record and care since last [facility] visit was updated with review of diagnostic studies and change in clinical status since last visit were documented.  HPI: The patient has medical diagnoses of hypertension, GERD, ulcerative colitis, dementia and  Protein-caloric malnutrition. She last had labs 11/27/15. Chloride has been elevated , ranging from 112-116 but sodium was normal. GFR was minimally decreased at 59. TSH was therapeutic at 2.308. She has had mild hyperglycemia with values ranging from 101-125. There is no A1c on record. At this time she is not on antihypertensive. Blood pressure is well controlled.  Review of systems was answered by her husband as she has dementia. He states her major issues are poor intake intermittently. She is in the restorative feeding program. She also has recurrent conjunctivitis and gentamicin drops are ordered as needed and he also has a cleansing protocol. He denies associated extrinsic symptoms of sneezing. He states that she is nonambulatory despite being physically  strong He denies any other active symptoms.  Physical exam:  Pertinent or positive findings: She is nonverbal. She lies in bed with no movement except for a knitting type activity of her hands as she fingers the bed clothing. Minimal erythema of the external eyelids. She resists exam of the oral cavity. She exhibits rigidity and some cogwheeling of the lower extremities. Again she resists exam. Posterior tibial pulses are slightly decreased. She has mild osteoarthritic change changes.  General appearance:Adequately nourished; no acute distress , increased work of breathing is present.   Lymphatic: No lymphadenopathy about the head, neck, axilla . Eyes: No lid edema is present. There is no scleral icterus. Ears:  External ear exam  shows no significant lesions or deformities.   Nose:  External nasal examination shows no deformity or inflammation. Nasal mucosa are pink and moist without lesions ,exudates Oral exam: lips and gums are healthy appearing. Neck:  No thyromegaly, masses, tenderness noted.    Heart:  Normal rate and regular rhythm. S1 and S2 normal without gallop, murmur, click, rub .  Lungs:Chest clear to auscultation without wheezes, rhonchi,rales , rubs. Abdomen:Bowel sounds are normal. Abdomen is soft and nontender with no organomegaly, hernias,masses. GU: deferred . Extremities:  No cyanosis, clubbing,edema  Neurologic exam : Balance,Rhomberg,finger to nose testing could not be completed due to clinical state Skin: Warm & dry w/o tenting. No significant lesions or rash.    See summary under each active problem in the Problem List with associated updated therapeutic plan

## 2016-03-03 NOTE — Assessment & Plan Note (Signed)
Medications not indicated as blood pressure is well controlled

## 2016-03-08 ENCOUNTER — Non-Acute Institutional Stay (SKILLED_NURSING_FACILITY): Payer: Medicare Other | Admitting: Internal Medicine

## 2016-03-08 ENCOUNTER — Encounter: Payer: Self-pay | Admitting: Internal Medicine

## 2016-03-08 DIAGNOSIS — E43 Unspecified severe protein-calorie malnutrition: Secondary | ICD-10-CM

## 2016-03-08 DIAGNOSIS — R627 Adult failure to thrive: Secondary | ICD-10-CM | POA: Diagnosis not present

## 2016-03-08 DIAGNOSIS — H6691 Otitis media, unspecified, right ear: Secondary | ICD-10-CM

## 2016-03-08 DIAGNOSIS — B9689 Other specified bacterial agents as the cause of diseases classified elsewhere: Secondary | ICD-10-CM | POA: Insufficient documentation

## 2016-03-08 DIAGNOSIS — H6690 Otitis media, unspecified, unspecified ear: Secondary | ICD-10-CM | POA: Insufficient documentation

## 2016-03-08 NOTE — Progress Notes (Signed)
Location:   [facility] Room Number: 151/P Place of Service:  SNF [phone] Provider:  Abigail MiyamotoArlo Elna Radovich  William Hopper, MD  Patient Care Team: Pecola LawlessWilliam F Hopper, MD as PCP - General (Internal Medicine)  Extended Emergency Contact Information Primary Emergency Contact: Leonia ReevesLuking,William  [place] Home Phone: [phone] Mobile Phone: [phone] Relation: Spouse  Code Status:  DNR Goals of care: Advanced Directive information Advanced Directives 03/08/2016  Does patient have an advance directive? Yes  Type of Advance Directive Out of facility DNR (pink MOST or yellow form)  Does patient want to make changes to advanced directive? No - Patient declined  Copy of advanced directive(s) in chart? Yes     Chief Complaint  Patient presents with  . Acute Visit    Infected right ear    HPI:  Pt is a 82 y.o. female seen today for an acute visit for Concerns of an infection of the right ear.  When I was called about this last week it was told there was significant erythema and edema of the external aspects of the right ear-did prescribe Rocephin 1 g IM-she has responded well to Rocephin in the past.  On evaluation today the ear is minimally erythematous here this appears to have largely cleared up-appears to still be some irritation on the inner aspect here-otherwise exam is fairly benign.  She has been afebrile.  She does have a history of progressing dementia with failure to thrive but this appears to have stabilized as well her appetite is somewhat better than it was several weeks ago although still somewhat intermittent--weight appears to be covering in the mid 160s.  Other than the ear issues no acute issues are apparent per her husband who is very attentive   Past Medical History  Diagnosis Date  . Dementia associated with other underlying disease without behavioral disturbance   . Muscle weakness (generalized)   . Lack of coordination   .  Urinary tract infection, site not specified 10/2015    Proteus UTI  . Hyperosmolality and hypernatremia     due to dehydration  . Senile arthritis   . Hypertension, essential   . Gastroesophageal reflux disease without esophagitis   . Ulcerative colitis, unspecified, without complications [facility](HCC)    Past Surgical History  Procedure Laterality Date  . Tonsillectomy    . G3p3    . Bladder prolapse surgery  2002    Allergies  Allergen Reactions  . Penicillins     [facility]  Hospitalized 3/23-09/19/2017 she tolerated Keflex for Proteus urinary tract infection without reaction     Current Outpatient Prescriptions on File Prior to Visit  Medication Sig Dispense Refill  . acetaminophen (TYLENOL) 650 MG CR tablet Take 650 mg by mouth every 4 (four) hours as needed for pain.    . bisacodyl (DULCOLAX) 10 MG suppository Place 10 mg rectally at bedtime as needed for moderate constipation.    Marland Kitchen. Dextromethorphan-Guaifenesin (ROBITUSSIN COUGH+ CHEST MAX ST) 10-200 MG/5ML LIQD Take by mouth. Take 5 ml by mouth every 6 hours as needed for cough and congestion    . docusate (COLACE) 50 MG/5ML liquid Take 100 mg by mouth 2 (two) times daily. Dilute in 6 oz to 8 oz of juice of milk    . folic acid (FOLVITE) 1 MG tablet Take 1 tablet (1 mg total) by mouth daily.    Marland Kitchen. gentamicin (GARAMYCIN) 0.3 % ophthalmic solution Place 2 drops into both eyes 3 (three) times  daily. 10 day course starting on May 26-and when necessary after 10 day course is completed    . IPRATROPIUM BROMIDE IN 2.66ml inhalation every 6 hours as needed for wheezing, SOB, Congestion    . loratadine (CLARITIN) 10 MG tablet Take 10 mg by mouth daily.    Marland Kitchen LORazepam (ATIVAN) 0.5 MG tablet Take 0.5 mg by mouth every 6 (six) hours as needed for anxiety.    Marland Kitchen omeprazole (PRILOSEC) 20 MG capsule Take 20 mg by mouth every evening.     . ondansetron (ZOFRAN) 4 MG tablet Take 4 mg by mouth every 6 (six) hours as needed for nausea or  vomiting.    . polyethylene glycol (MIRALAX / GLYCOLAX) packet Take 17 g by mouth daily.    Marland Kitchen Propylene Glycol (SYSTANE BALANCE) 0.6 % SOLN Apply to eye 2 (two) times daily as needed (For dry eyes).    . Sodium Phosphates (FLEET ENEMA RE) Place 133 mLs rectally as needed (For Constipation).    . sorbitol 70 % solution Take 30 mLs by mouth daily as needed.    . sulfaSALAzine (AZULFIDINE) 500 MG tablet Take 1,000 mg by mouth 3 (three) times daily. After meals    . traZODone (DESYREL) 50 MG tablet Take 50 mg by mouth at bedtime as needed for sleep.     No current facility-administered medications on file prior to visit.    Review of Systems   This is essentially unattainable secondary to dementia please see history of present illness  Immunization History  Administered Date(s) Administered  . Influenza-Unspecified 06/04/2014  . PPD Test 08/22/2014   Pertinent  Health Maintenance Due  Topic Date Due  . PNA vac Low Risk Adult (2 of 2 - PPSV23) 08/29/2016 (Originally 04/29/2012)  . DEXA SCAN  08/30/2023 (Originally 03/26/1994)  . INFLUENZA VACCINE  03/29/2016   No flowsheet data found. Functional Status Survey:    Filed Vitals:   03/08/16 1500  BP: 136/80  Pulse: 82  Temp: 98.4 F (36.9 C)  TempSrc: Oral  Resp: 20  Height: 5\' 5"  (1.651 m)  Weight: 164 lb 9.6 oz (74.662 kg)   Body mass index is 27.39 kg/(m^2). Physical Exam    Gen. this is a frail elderly female in no distress sitting comfortably in her wheelchair she exhibits signs of significant dementia but is smiling today which is her baseline.  Her skin is warm and dry she has numerous seborrheic keratosis which is not new.  Right ear there is very minimal erythema more in the inner aspect of her outer ear the outer ear appears largely flesh-colored there is still some mild tenderness to palpation here but apparently this is significantly improved Otoscope  exam she appears to have a mild amount of wax in the inner ear I  do not see any increased exudate possibly  some minimal erythema  Heart is regular rate and rhythm distant heart sounds without murmur gallop or rub she does not really have significant lower extremity edema.  Chest is clear to auscultation with poor respiratory effort  Abdomen is protuberant soft nontender with positive bowel sounds.  Muscle skeletal does have a somewhat as previously described by Dr. Alwyn Ren knitting-type motion of her upper extremities-has diffuse arthritic changes this appears relatively baseline area  Neurologic as noted above I could not really appreciate lateralizing findings cranial nerves appear grossly intact.  Psych again findings consistent with significant dementia.  .   Labs reviewed:  Recent Labs  11/19/15 1739  11/20/15  5621  11/23/15 0534 2015-12-01 0525 11/27/15 0720  NA 160*  < > 158*  < > 144 143 142  K 3.3*  < > 4.4  < > 5.0 3.6 4.1  CL 128*  < > 129*  < > 116* 113* 112*  CO2 25  < > 20*  < > GLUCOSE 103*  < > 113*  < > 101* 125* 108*  BUN 42*  < > 35*  < > 14 12 22*  CREATININE 1.37*  < > 1.29*  < > 1.01* 0.86 0.86  CALCIUM 8.9  < > 8.9  < > 8.9 8.7* 9.2  MG 2.9*  --  3.3*  --   --   --   --   < > = values in this interval not displayed.  Recent Labs  05/28/15 0750 11/19/15 1116  AST 19 21  ALT 10* 14  ALKPHOS 88 88  BILITOT 0.5 0.7  PROT 7.5 7.7  ALBUMIN 3.9 3.7    Recent Labs  11/19/15 1116 11/20/15 1005 11/21/15 0554 11/22/15 0851 11/23/15 0534 11/27/15 0720  WBC 15.8* 9.2 12.3* 7.4 7.6 9.2  NEUTROABS 11.8* 6.3 8.2*  --   --   --   HGB 13.3 7.9* 11.8* 10.9* 11.1* 12.1  HCT 43.7 33.0* 39.0 35.0* 35.3* 38.1  MCV 100.0 99.4 97.3 95.1 93.9 94.5  PLT 257 117* 229 203 196 300   Lab Results  Component Value Date   TSH 2.308 11/19/2015   No results found for: HGBA1C No results found for: CHOL, HDL, LDLCALC, LDLDIRECT, TRIG, CHOLHDL  Significant Diagnostic Results in last 30 days:  No results  found.  Assessment/Plan #1-ear infection otitis externa--new problem--outer ear appears fairly unremarkable with a minimal amount of erythema --at this point do not think a parental antibiotic is further warranted-will start a topical eardrop--and monitor this was discussed with her husband at bedside who is in agreement-will start ciprofloxacin 0.2%-3 drops in right ear 2 times a day for 7 days and monitor  #2 failure to thrive with progressive dementia-she appears to be doing fairly well with supportive care --continues restorative dining--.  [id number] note greater than 25 minutes spent assessing patient-discussing her status with nursing staff as well as with her husband at bedsidere-viewing her chart coordinating and formulating a plan of care with her husband's input      London Sheer, [place] [phone]

## 2016-03-11 ENCOUNTER — Non-Acute Institutional Stay (SKILLED_NURSING_FACILITY): Payer: Medicare Other | Admitting: Internal Medicine

## 2016-03-11 DIAGNOSIS — H60391 Other infective otitis externa, right ear: Secondary | ICD-10-CM

## 2016-03-11 NOTE — Progress Notes (Signed)
Patient ID: Theresa Ford, female   DOB: Dec 12, 1926, 82 y.o.   MRN: [id number]  Location:   [facility]   Place of Service:  SNF [phone]) Provider:  Edmon Crape  Marga Melnick, MD  Patient Care Team: Pecola Lawless, MD as PCP - General (Internal Medicine)  Extended Emergency Contact Information Primary Emergency Contact: Leonia Reeves [place] Home Phone: [phone] Mobile Phone: [phone] Relation: Spouse  Code Status:  DNR Goals of care: Advanced Directive information Advanced Directives 03/08/2016  Does patient have an advance directive? Yes  Type of Advance Directive Out of facility DNR (pink MOST or yellow form)  Does patient want to make changes to advanced directive? No - Patient declined  Copy of advanced directive(s) in chart? Yes     Chief Complaint  Patient presents with  . Acute Visit   Follow-up right ear infection HPI:  Pt is a 82 y.o. female seen today for an acute visit for local suspected right ear infection.  .  When I was called about this last week it was told there was significant erythema and edema of the external aspects of the right ear-did prescribe Rocephin 1 g IM-she has responded well to Rocephin in the past.   On evaluation earlier this week the ear was minimally erythematous here this appeared to have largely cleared up-appeared to still have some irritation on the inner aspect   She has been afebrile.  Rocephin as been discontinued and she has been started on  one-week course of Cipro eardrops  I did reevaluate here today and there is really no tenderness here  Otoscopic. Exam appeared be fairly benign  did not really see erythema or exudate      Past Medical History  Diagnosis Date  . Dementia associated with other underlying disease without behavioral disturbance   . Muscle weakness (generalized)   . Lack of coordination   . Urinary tract infection, site not specified 10/2015    Proteus UTI  .  Hyperosmolality and hypernatremia     due to dehydration  . Senile arthritis   . Hypertension, essential   . Gastroesophageal reflux disease without esophagitis   . Ulcerative colitis, unspecified, without complications [facility])    Past Surgical History  Procedure Laterality Date  . Tonsillectomy    . G3p3    . Bladder prolapse surgery  2002    Allergies  Allergen Reactions  . Penicillins     Unknown reaction-Listed on [facility]  Hospitalized 3/23-2015/11/01 she tolerated Keflex for Proteus urinary tract infection without reaction     Current Outpatient Prescriptions on File Prior to Visit  Medication Sig Dispense Refill  . acetaminophen (TYLENOL) 650 MG CR tablet Take 650 mg by mouth every 4 (four) hours as needed for pain.    . bisacodyl (DULCOLAX) 10 MG suppository Place 10 mg rectally at bedtime as needed for moderate constipation.    . cefTRIAXone (ROCEPHIN) 1 g injection 1 gram IM for  Days  Once a day  END DATE 03/12/16    . Dextromethorphan-Guaifenesin (ROBITUSSIN COUGH+ CHEST MAX ST) 10-200 MG/5ML LIQD Take by mouth. Take 5 ml by mouth every 6 hours as needed for cough and congestion    . docusate (COLACE) 50 MG/5ML liquid Take 100 mg by mouth 2 (two) times daily. Dilute in 6 oz to 8 oz of juice of milk    . folic acid (FOLVITE) 1 MG tablet Take 1 tablet (1 mg total) by mouth daily.    Marland Kitchen  gentamicin (GARAMYCIN) 0.3 % ophthalmic solution Place 2 drops into both eyes 3 (three) times daily. 10 day course starting on May 26-and when necessary after 10 day course is completed    . IPRATROPIUM BROMIDE IN 2.80ml inhalation every 6 hours as needed for wheezing, SOB, Congestion    . loratadine (CLARITIN) 10 MG tablet Take 10 mg by mouth daily.    Marland Kitchen LORazepam (ATIVAN) 0.5 MG tablet Take 0.5 mg by mouth every 6 (six) hours as needed for anxiety.    Marland Kitchen omeprazole (PRILOSEC) 20 MG capsule Take 20 mg by mouth every evening.     . ondansetron (ZOFRAN) 4 MG tablet Take 4 mg by mouth every 6 (six) hours as  needed for nausea or vomiting.    . polyethylene glycol (MIRALAX / GLYCOLAX) packet Take 17 g by mouth daily.    . Probiotic Product (RISA-BID PROBIOTIC PO) ( acidophilus-I.b-bb-s. Thermophl) [ OTC; 1 billion-250 cell-mg; oral twice a day    . Propylene Glycol (SYSTANE BALANCE) 0.6 % SOLN Apply to eye 2 (two) times daily as needed (For dry eyes).    . Sodium Phosphates (FLEET ENEMA RE) Place 133 mLs rectally as needed (For Constipation).    . sorbitol 70 % solution Take 30 mLs by mouth daily as needed.    . sulfaSALAzine (AZULFIDINE) 500 MG tablet Take 1,000 mg by mouth 3 (three) times daily. After meals    . traZODone (DESYREL) 50 MG tablet Take 50 mg by mouth at bedtime as needed for sleep.     No current facility-administered medications on file prior to visit.    Review of Systems   This is essentially unattainable secondary to dementia please see history of present illness  Immunization History  Administered Date(s) Administered  . Influenza-Unspecified 06/04/2014  . PPD Test 08/22/2014   Pertinent  Health Maintenance Due  Topic Date Due  . PNA vac Low Risk Adult (2 of 2 - PPSV23) 08/29/2016 (Originally 04/29/2012)  . DEXA SCAN  08/30/2023 (Originally 03/26/1994)  . INFLUENZA VACCINE  03/29/2016   No flowsheet data found. Functional Status Survey:     There is no weight on file to calculate BMI. Physical Exam   Patient is afebrile Gen. this is a frail elderly female in no distress sitting comfortably in her wheelchair she exhibits signs of significant dementia but is smiling today which is her baseline.  Her skin is warm and dry she has numerous seborrheic keratosis which is not new.   Right ear appears essentially back to baseline there is really no tenderness to palpation of the area-I do not see erythema or exudate-otoscopic exam shows a small amount of wax in the ear canal I do not really see any exudate or  erythema there is no tenderness with the  exam      .   Labs reviewed:  Recent Labs  11/19/15 1739  11/20/15 0515  11/23/15 0534 December 21, 2015 0525 11/27/15 0720  NA 160*  < > 158*  < > 144 143 142  K 3.3*  < > 4.4  < > 5.0 3.6 4.1  CL 128*  < > 129*  < > 116* 113* 112*  CO2 25  < > 20*  < > GLUCOSE 103*  < > 113*  < > 101* 125* 108*  BUN 42*  < > 35*  < > 14 12 22*  CREATININE 1.37*  < > 1.29*  < > 1.01* 0.86 0.86  CALCIUM 8.9  < >  8.9  < > 8.9 8.7* 9.2  MG 2.9*  --  3.3*  --   --   --   --   < > = values in this interval not displayed.  Recent Labs  05/28/15 0750 11/19/15 1116  AST 19 21  ALT 10* 14  ALKPHOS 88 88  BILITOT 0.5 0.7  PROT 7.5 7.7  ALBUMIN 3.9 3.7    Recent Labs  11/19/15 1116 11/20/15 1005 11/21/15 0554 11/22/15 0851 11/23/15 0534 11/27/15 0720  WBC 15.8* 9.2 12.3* 7.4 7.6 9.2  NEUTROABS 11.8* 6.3 8.2*  --   --   --   HGB 13.3 7.9* 11.8* 10.9* 11.1* 12.1  HCT 43.7 33.0* 39.0 35.0* 35.3* 38.1  MCV 100.0 99.4 97.3 95.1 93.9 94.5  PLT 257 117* 229 203 196 300   Lab Results  Component Value Date   TSH 2.308 11/19/2015   No results found for: HGBA1C No results found for: CHOL, HDL, LDLCALC, LDLDIRECT, TRIG, CHOLHDL  Significant Diagnostic Results in last 30 days:  No results found.  Assessment/Plan #1-ear infection otitis externa This appears to be resolving unremarkably she is now on a topical eardrops Cipro and has approximately 4 days left- will complete this course-- ear appears to be near baseline now there is really no tenderness or significant findings on physical exam which is encouraging at this point continue to monitor   [id number]         Edmon CrapeArlo Ifrah Vest, PA-C [phone]

## 2016-03-18 ENCOUNTER — Telehealth: Payer: Self-pay | Admitting: Emergency Medicine

## 2016-03-18 NOTE — Telephone Encounter (Signed)
Medical records called and asked if somebody can sign off on a urinalsis for Dr Alwyn RenHopper. It is from May. Can you do this for her?

## 2016-03-18 NOTE — Telephone Encounter (Signed)
That is fine 

## 2016-04-15 ENCOUNTER — Encounter: Payer: Self-pay | Admitting: Internal Medicine

## 2016-04-15 ENCOUNTER — Non-Acute Institutional Stay (SKILLED_NURSING_FACILITY): Payer: Medicare Other | Admitting: Internal Medicine

## 2016-04-15 DIAGNOSIS — K5100 Ulcerative (chronic) pancolitis without complications: Secondary | ICD-10-CM

## 2016-04-15 DIAGNOSIS — I10 Essential (primary) hypertension: Secondary | ICD-10-CM

## 2016-04-15 DIAGNOSIS — G309 Alzheimer's disease, unspecified: Secondary | ICD-10-CM | POA: Diagnosis not present

## 2016-04-15 DIAGNOSIS — R627 Adult failure to thrive: Secondary | ICD-10-CM

## 2016-04-15 DIAGNOSIS — F0280 Dementia in other diseases classified elsewhere without behavioral disturbance: Secondary | ICD-10-CM

## 2016-04-15 NOTE — Progress Notes (Signed)
Location:   [facility] Room Number: 151/P Place of Service:  SNF [phone](31) Provider:  Abigail MiyamotoArlo,Lynise Porr  William Hopper, MD  Patient Care Team: Pecola LawlessWilliam F Hopper, MD as PCP - General (Internal Medicine)  Extended Emergency Contact Information Primary Emergency Contact: Leonia ReevesLuking,William  [place] Home Phone: [phone] Mobile Phone: [phone] Relation: Spouse  Code Status:  DNR Goals of care: Advanced Directive information Advanced Directives 04/15/2016  Does patient have an advance directive? Yes  Type of Advance Directive Out of facility DNR (pink MOST or yellow form)  Does patient want to make changes to advanced directive? No - Patient declined  Copy of advanced directive(s) in chart? Yes  Pre-existing out of facility DNR order (yellow form or pink MOST form) -     Chief Complaint  Patient presents with  . Medical Management of Chronic Issues    Routine visit  For management of chronic medical conditions including dementia-hypertension-failure to thrive-history of recurrent conjunctivitis. History of ulcerative colitis     HPI:  Pt is a 82 y.o. female seen today for medical management of chronic diseases.  As noted above-she has achieved a relative. If stability here despite her significant dementia and previous history of failure to thrive.  Earlier this year she was hospitalized for hyponatremia and treated aggressively with IV fluids-she return in the facility and actually has return essentially back to her baseline-apparently she is eating and drinking somewhat better although her appetite remains spotty.  At times will develop a fever thought most likely secondary UTI she has responded well in the past to Rocephin.  She also has a history of recurrent bilateral conjunctivitis that usually responds to a course of gentamicin.  Regards to ulcerative colitis this has really not been an issue during her stay here she continues on   Sulfasalazine  Current weight 168 actually appears to be up about 8 pounds since  Late  March of this year     Past Medical History:  Diagnosis Date  . Dementia associated with other underlying disease without behavioral disturbance   . Gastroesophageal reflux disease without esophagitis   . Hyperosmolality and hypernatremia    due to dehydration  . Hypertension, essential   . Lack of coordination   . Muscle weakness (generalized)   . Senile arthritis   . Ulcerative colitis, unspecified, without complications (HCC)   . Urinary tract infection, site not specified 10/2015   Proteus UTI   Past Surgical History:  Procedure Laterality Date  . Bladder prolapse surgery  2002  . G3P3    . TONSILLECTOMY      Allergies  Allergen Reactions  . Penicillins     [facility]  Hospitalized 3/23-03/12/2015 she tolerated Keflex for Proteus urinary tract infection without reaction    Current Outpatient Prescriptions on File Prior to Visit  Medication Sig Dispense Refill  . acetaminophen (TYLENOL) 650 MG CR tablet Take 650 mg by mouth every 4 (four) hours as needed for pain.    . bisacodyl (DULCOLAX) 10 MG suppository Place 10 mg rectally at bedtime as needed for moderate constipation.    Marland Kitchen. Dextromethorphan-Guaifenesin (ROBITUSSIN COUGH+ CHEST MAX ST) 10-200 MG/5ML LIQD Take by mouth. Take 5 ml by mouth every 6 hours as needed for cough and congestion    . docusate (COLACE) 50 MG/5ML liquid Take 100 mg by mouth 2 (two) times daily. Dilute in 6 oz to 8 oz of juice of milk    . folic acid (FOLVITE)  1 MG tablet Take 1 tablet (1 mg total) by mouth daily.    Marland Kitchen. gentamicin (GARAMYCIN) 0.3 % ophthalmic solution Place 2 drops into both eyes 3 (three) times daily. 10 day course starting on May 26-and when necessary after 10 day course is completed    . IPRATROPIUM BROMIDE IN 2.595ml inhalation every 6 hours as needed for wheezing, SOB, Congestion    . loratadine (CLARITIN) 10 MG tablet  Take 10 mg by mouth daily.    Marland Kitchen. omeprazole (PRILOSEC) 20 MG capsule Take 20 mg by mouth every evening.     . ondansetron (ZOFRAN) 4 MG tablet Take 4 mg by mouth every 6 (six) hours as needed for nausea or vomiting.    . polyethylene glycol (MIRALAX / GLYCOLAX) packet Take 17 g by mouth daily.    Marland Kitchen. Propylene Glycol (SYSTANE BALANCE) 0.6 % SOLN Apply to eye 2 (two) times daily as needed (For dry eyes).    . Sodium Phosphates (FLEET ENEMA RE) Place 133 mLs rectally as needed (For Constipation).    . sorbitol 70 % solution Take 30 mLs by mouth daily as needed.    . sulfaSALAzine (AZULFIDINE) 500 MG tablet Take 1,000 mg by mouth 3 (three) times daily. After meals    . traZODone (DESYREL) 50 MG tablet Take 50 mg by mouth at bedtime as needed for sleep.     No current facility-administered medications on file prior to visit.     Review of Systems   Essentially unattainable secondary to severe dementia please see history of present illness again she appears to be doing well with supportive care  Immunization History  Administered Date(s) Administered  . Influenza-Unspecified 06/04/2014  . PPD Test 08/22/2014   Pertinent  Health Maintenance Due  Topic Date Due  . INFLUENZA VACCINE  07/29/2016 (Originally 03/29/2016)  . PNA vac Low Risk Adult (2 of 2 - PPSV23) 08/29/2016 (Originally 04/29/2012)  . DEXA SCAN  08/30/2023 (Originally 03/26/1994)   No flowsheet data found. Functional Status Survey:    Vitals:   04/15/16 1559  BP: 125/66  Pulse: 72  Resp: 16  Temp: 97.8 F (36.6 C)  TempSrc: Oral  Weight: 168 lb (76.2 kg)   Body mass index is 27.96 kg/m. Physical Exam  .  In general this is a frail female in no distress sitting comfortably in her wheelchair.  Her skin is warm and dry she does have numerous seborrheic keratosis.  Eyes   Sclera and conjunctiva  appear to be clear-visual acuity appears to be grossly intact  Oropharynx was difficult to assess secondary to patient  holding her mouth shut.  Chest clear to auscultation there is no labored breathing there is poor respiratory effort.  Heart is regular rate and rhythm without murmur gallop or rub she has minimal lower extremity edema.  Abdomen protuberant soft nontender positive bowel sounds.  Musculoskeletal moves all extremities 4 at baseline it appears I do not note changes other than arthritic. His have somewhat of a hand knitting motion of her left hand this evening although this is not really new  Neurologic again she does have significant dementia I do not note any lateralizing findings however cranial nerves appear to be intact she speaks very minimally.   psych again she does have severe dementia Labs reviewed:  Recent Labs  11/19/15 1739  11/20/15 0515  11/23/15 0534 2016-03-03 0525 11/27/15 0720  NA 160*  < > 158*  < > 144 143 142  K 3.3*  < >  4.4  < > 5.0 3.6 4.1  CL 128*  < > 129*  < > 116* 113* 112*  CO2 25  < > 20*  < > 22 22 22   GLUCOSE 103*  < > 113*  < > 101* 125* 108*  BUN 42*  < > 35*  < > 14 12 22*  CREATININE 1.37*  < > 1.29*  < > 1.01* 0.86 0.86  CALCIUM 8.9  < > 8.9  < > 8.9 8.7* 9.2  MG 2.9*  --  3.3*  --   --   --   --   < > = values in this interval not displayed.  Recent Labs  05/28/15 0750 11/19/15 1116  AST 19 21  ALT 10* 14  ALKPHOS 88 88  BILITOT 0.5 0.7  PROT 7.5 7.7  ALBUMIN 3.9 3.7    Recent Labs  11/19/15 1116 11/20/15 1005 11/21/15 0554 11/22/15 0851 11/23/15 0534 11/27/15 0720  WBC 15.8* 9.2 12.3* 7.4 7.6 9.2  NEUTROABS 11.8* 6.3 8.2*  --   --   --   HGB 13.3 7.9* 11.8* 10.9* 11.1* 12.1  HCT 43.7 33.0* 39.0 35.0* 35.3* 38.1  MCV 100.0 99.4 97.3 95.1 93.9 94.5  PLT 257 117* 229 203 196 300   Lab Results  Component Value Date   TSH 2.308 11/19/2015   No results found for: HGBA1C No results found for: CHOL, HDL, LDLCALC, LDLDIRECT, TRIG, CHOLHDL  Significant Diagnostic Results in last 30 days:  No results  found.  Assessment/Plan  : 1 history of conjunctivitis  At this point appears stable she does have when necessary gentamicin if needed since she has  frequent recurrences  #2 history of failure to thrive hypernatremia complicated with history of severe dementia-apparently her appetite is improved somewhat-family desires conservative follow-up no labs at this point will monitor clinically she appears to be essentially at her baseline today per exam --I saw her eating her dinner tonight with support from her husband appear to be doing well-apparently she ate quite well earlier today as well - Weight at 168 appears to be stabilized.  #3 history of ulcerative colitis she continues on sulfasalazine this is been stable for an extended period of time.  #5 history constipation she is on MiraLAX as well as Colace twice a day also Dulcolax suppository when necessary apparently she is having fairly regular bowel movements although this will have to be monitored closely-this has been an issue in the past have to be watched  #6 history of hypertension this appears stable although she is not really on any meds at this point will monitor  [id number].  [id number]

## 2016-06-14 ENCOUNTER — Encounter: Payer: Self-pay | Admitting: Internal Medicine

## 2016-06-16 NOTE — Progress Notes (Signed)
This encounter was created in error - please disregard.

## 2016-06-20 ENCOUNTER — Non-Acute Institutional Stay (SKILLED_NURSING_FACILITY): Payer: Medicare Other | Admitting: Internal Medicine

## 2016-06-20 ENCOUNTER — Encounter: Payer: Self-pay | Admitting: Internal Medicine

## 2016-06-20 DIAGNOSIS — I10 Essential (primary) hypertension: Secondary | ICD-10-CM

## 2016-06-20 DIAGNOSIS — F0280 Dementia in other diseases classified elsewhere without behavioral disturbance: Secondary | ICD-10-CM

## 2016-06-20 DIAGNOSIS — G309 Alzheimer's disease, unspecified: Secondary | ICD-10-CM | POA: Diagnosis not present

## 2016-06-20 DIAGNOSIS — K5100 Ulcerative (chronic) pancolitis without complications: Secondary | ICD-10-CM

## 2016-06-20 DIAGNOSIS — H109 Unspecified conjunctivitis: Secondary | ICD-10-CM | POA: Diagnosis not present

## 2016-06-20 NOTE — Progress Notes (Signed)
Location:   [facility] Room Number: 151/P Place of Service:  SNF (31) Provider:  Kaeleb Emond,Jasiel Apachito  No primary care provider on file.  Patient Care Team: Mahlon Gammon, MD as Consulting Physician (Geriatric Medicine)  Extended Emergency Contact Information Primary Emergency Contact: Leonia Reeves [place] Home Phone: [phone] Mobile Phone: [phone] Relation: Spouse  Code Status:  DNR Goals of care: Advanced Directive information Advanced Directives 06/20/2016  Does patient have an advance directive? Yes  Type of Advance Directive Out of facility DNR (pink MOST or yellow form)  Does patient want to make changes to advanced directive? No - Patient declined  Copy of advanced directive(s) in chart? Yes  Pre-existing out of facility DNR order (yellow form or pink MOST form) -     Chief Complaint  Patient presents with  . Medical Management of Chronic Issues    Routine Visit    HPI:  Pt is a 82 y.o. female seen today for medical management of chronic diseases. Patient has history of dementia,ulcerative colitis, Chronic conjunctivitis. UTI and hyponatremia. She has been stable since her last hospitalization in 03/17. Patient continues to be stable. Her weight has actually increased to 169 lbs. Unable to obtain any history from her. She continues to be on restorative feeding.No Blood work has been done on her per family request.  Past Medical History:  Diagnosis Date  . Dementia associated with other underlying disease without behavioral disturbance   . Gastroesophageal reflux disease without esophagitis   . Hyperosmolality and hypernatremia    due to dehydration  . Hypertension, essential   . Lack of coordination   . Muscle weakness (generalized)   . Senile arthritis   . Ulcerative colitis, unspecified, without complications (HCC)   . Urinary tract infection, site not specified 10/2015   Proteus UTI   Past Surgical History:    Procedure Laterality Date  . Bladder prolapse surgery  2002  . G3P3    . TONSILLECTOMY      Allergies  Allergen Reactions  . Penicillins     Unknown reaction-Listed on [facility]  Hospitalized 3/23-03/11/2015 she tolerated Keflex for Proteus urinary tract infection without reaction    Current Outpatient Prescriptions on File Prior to Visit  Medication Sig Dispense Refill  . docusate (COLACE) 50 MG/5ML liquid Take 100 mg by mouth 2 (two) times daily. Dilute in 6 oz to 8 oz of juice of milk    . folic acid (FOLVITE) 1 MG tablet Take 1 tablet (1 mg total) by mouth daily.    Marland Kitchen gentamicin (GARAMYCIN) 0.3 % ophthalmic solution Place 2 drops into both eyes 3 (three) times daily. 10 day course starting on May 26-and when necessary after 10 day course is completed    . loratadine (CLARITIN) 10 MG tablet Take 10 mg by mouth daily.    Marland Kitchen omeprazole (PRILOSEC) 20 MG capsule Take 20 mg by mouth every evening.     . polyethylene glycol (MIRALAX / GLYCOLAX) packet Take 17 g by mouth daily.    . Sodium Phosphates (FLEET ENEMA RE) Place 133 mLs rectally as needed (For Constipation).    . sorbitol 70 % solution Take 30 mLs by mouth daily as needed.    . sulfaSALAzine (AZULFIDINE) 500 MG tablet Take 1,000 mg by mouth 3 (three) times daily. After meals     No current facility-administered medications on file prior to visit.      Review of Systems  Unable to perform ROS: Dementia  Immunization History  Administered Date(s) Administered  . Influenza-Unspecified 06/04/2014, 05/27/2016  . PPD Test 08/22/2014   Pertinent  Health Maintenance Due  Topic Date Due  . PNA vac Low Risk Adult (2 of 2 - PPSV23) 08/29/2016 (Originally 04/29/2012)  . DEXA SCAN  08/30/2023 (Originally 03/26/1994)  . INFLUENZA VACCINE  Completed   No flowsheet data found. Functional Status Survey:    Vitals:   06/19/16 1531  BP: 124/67  Pulse: 75  Resp: 20  Temp: 97 F (36.1 C)  TempSrc: Oral  SpO2: 92%   There is no  height or weight on file to calculate BMI. Physical Exam  Constitutional: She appears well-developed and well-nourished.  HENT:  Head: Normocephalic.  Cardiovascular: Normal rate, regular rhythm and normal heart sounds.   Pulmonary/Chest: Effort normal and breath sounds normal. No respiratory distress. She has no wheezes. She has no rales.  Abdominal: Soft. Bowel sounds are normal. She exhibits no distension. There is no tenderness. There is no rebound.  Musculoskeletal: She exhibits no edema.  Neurological: She is alert.  Does not responds to even her name.    Labs reviewed:  Recent Labs  11/19/15 1739  11/20/15 0515  11/23/15 0534 Nov 22, 2015 0525 11/27/15 0720  NA 160*  < > 158*  < > 144 143 142  K 3.3*  < > 4.4  < > 5.0 3.6 4.1  CL 128*  < > 129*  < > 116* 113* 112*  CO2 25  < > 20*  < > 22 22 22   GLUCOSE 103*  < > 113*  < > 101* 125* 108*  BUN 42*  < > 35*  < > 14 12 22*  CREATININE 1.37*  < > 1.29*  < > 1.01* 0.86 0.86  CALCIUM 8.9  < > 8.9  < > 8.9 8.7* 9.2  MG 2.9*  --  3.3*  --   --   --   --   < > = values in this interval not displayed.  Recent Labs  11/19/15 1116  AST 21  ALT 14  ALKPHOS 88  BILITOT 0.7  PROT 7.7  ALBUMIN 3.7    Recent Labs  11/19/15 1116 11/20/15 1005 11/21/15 0554 11/22/15 0851 11/23/15 0534 11/27/15 0720  WBC 15.8* 9.2 12.3* 7.4 7.6 9.2  NEUTROABS 11.8* 6.3 8.2*  --   --   --   HGB 13.3 7.9* 11.8* 10.9* 11.1* 12.1  HCT 43.7 33.0* 39.0 35.0* 35.3* 38.1  MCV 100.0 99.4 97.3 95.1 93.9 94.5  PLT 257 117* 229 203 196 300   Lab Results  Component Value Date   TSH 2.308 11/19/2015   No results found for: HGBA1C No results found for: CHOL, HDL, LDLCALC, LDLDIRECT, TRIG, CHOLHDL  Significant Diagnostic Results in last 30 days:  No results found.  Assessment/Plan  Dementia  Will continue supportive care Will d/w family if they want any follow up Blood work.  Conjunctivitis of both eyes Responds well to  antibiotics  Essential hypertension Controlled. Not on any meds  Ulcerative pancolitis without complication  Continue Sulfasalzine     Family/ staff Communication:   Labs/tests ordered:

## 2016-07-13 ENCOUNTER — Non-Acute Institutional Stay (SKILLED_NURSING_FACILITY): Payer: Medicare Other | Admitting: Internal Medicine

## 2016-07-13 ENCOUNTER — Encounter: Payer: Self-pay | Admitting: Internal Medicine

## 2016-07-13 DIAGNOSIS — G309 Alzheimer's disease, unspecified: Secondary | ICD-10-CM

## 2016-07-13 DIAGNOSIS — K5900 Constipation, unspecified: Secondary | ICD-10-CM | POA: Diagnosis not present

## 2016-07-13 DIAGNOSIS — R627 Adult failure to thrive: Secondary | ICD-10-CM

## 2016-07-13 DIAGNOSIS — H109 Unspecified conjunctivitis: Secondary | ICD-10-CM | POA: Diagnosis not present

## 2016-07-13 DIAGNOSIS — F0280 Dementia in other diseases classified elsewhere without behavioral disturbance: Secondary | ICD-10-CM

## 2016-07-13 DIAGNOSIS — K5100 Ulcerative (chronic) pancolitis without complications: Secondary | ICD-10-CM

## 2016-07-13 NOTE — Progress Notes (Signed)
Location:   [facility] Room Number: 151/P Place of Service:  SNF (31) Provider:  Zander Ingham,Jamillah Camilo  No primary care provider on file.  Patient Care Team: Mahlon Gammon, MD as Consulting Physician (Geriatric Medicine)  Extended Emergency Contact Information Primary Emergency Contact: Leonia Reeves [place] Home Phone: [phone] Mobile Phone: [phone] Relation: Spouse  Code Status:  DNR Goals of care: Advanced Directive information Advanced Directives 07/13/2016  Does patient have an advance directive? Yes  Type of Advance Directive Out of facility DNR (pink MOST or yellow form)  Does patient want to make changes to advanced directive? No - Patient declined  Copy of advanced directive(s) in chart? Yes  Pre-existing out of facility DNR order (yellow form or pink MOST form) -     Chief Complaint  Patient presents with  . Medical Management of Chronic Issues    Routine Visit   Medical management of chronic medical conditions including dementia-hypertension-ulcerative colitis-recurrent conjunctivitis-allergic rhinitis-  HPI:  Pt is a 82 y.o. female seen today for medical management of chronic diseases as noted above.  She continues to enjoy. If stability-at one point she had significant failure to thrive Required hospitalization for severe hypernatremia she did respond well to IV fluids however-since her return she is actually been doing quite well her-her weight is stable-she has severe dementia but appears to be doing well with supportive care-she does have a very supportive family including her husband who visits her several times a day.  .     Past Medical History:  Diagnosis Date  . Dementia associated with other underlying disease without behavioral disturbance   . Gastroesophageal reflux disease without esophagitis   . Hyperosmolality and hypernatremia    due to dehydration  . Hypertension, essential   . Lack of  coordination   . Muscle weakness (generalized)   . Senile arthritis   . Ulcerative colitis, unspecified, without complications (HCC)   . Urinary tract infection, site not specified 10/2015   Proteus UTI   Past Surgical History:  Procedure Laterality Date  . Bladder prolapse surgery  2002  . G3P3    . TONSILLECTOMY      Allergies  Allergen Reactions  . Penicillins     Unknown reaction-Listed on [facility]  Hospitalized 3/23-03/27/2017 she tolerated Keflex for Proteus urinary tract infection without reaction    Current Outpatient Prescriptions on File Prior to Visit  Medication Sig Dispense Refill  . docusate (COLACE) 50 MG/5ML liquid Take 100 mg by mouth 2 (two) times daily. Dilute in 6 oz to 8 oz of juice of milk    . folic acid (FOLVITE) 1 MG tablet Take 1 tablet (1 mg total) by mouth daily.    Marland Kitchen gentamicin (GARAMYCIN) 0.3 % ophthalmic solution Place 2 drops into both eyes 3 (three) times daily. 10 day course starting on May 26-and when necessary after 10 day course is completed    . loratadine (CLARITIN) 10 MG tablet Take 10 mg by mouth daily.    Marland Kitchen omeprazole (PRILOSEC) 20 MG capsule Take 20 mg by mouth every evening.     . polyethylene glycol (MIRALAX / GLYCOLAX) packet Take 17 g by mouth daily.    . sorbitol 70 % solution Take 30 mLs by mouth daily as needed.    . sulfaSALAzine (AZULFIDINE) 500 MG tablet Take 1,000 mg by mouth 3 (three) times daily. After meals     No current facility-administered medications on file prior to visit.  Review of Systems  Essentially unattainable secondary to dementia please see history of present illness   Immunization History  Administered Date(s) Administered  . Influenza-Unspecified 06/04/2014, 05/27/2016  . PPD Test 08/22/2014  . Pneumococcal-Unspecified 06/09/2016   Pertinent  Health Maintenance Due  Topic Date Due  . DEXA SCAN  08/30/2023 (Originally 03/26/1992)  . INFLUENZA VACCINE  Completed  . PNA vac Low Risk Adult  Completed    No flowsheet data found. Functional Status Survey:    Vitals:   07/13/16 1448  BP: 123/70  Pulse: 78  Resp: 20  Temp: 98 F (36.7 C)  TempSrc: Oral   Weight is stable at 168 pounds Physical Exam In general this is a frail female in no distress currently lying in bed  Her skin is warm and dry she does have numerous seborrheic keratosis.  Eyes   Sclera and conjunctiva  appear to be clear-visual acuity appears to be grossly intact  Oropharynx was difficult to assess secondary to patient holding her mouth shut.  Chest clear to auscultation there is no labored breathing there is poor respiratory effort.  Heart is regular rate and rhythm without murmur gallop or rub she has minimal lower extremity edema.  Abdomen protuberant soft nontender positive bowel sounds.  Musculoskeletal moves all extremities 4 at baseline it appears I do not note changes other than arthritic.  has a hand knitting motion of her hand this is most prominent when she is sitting up  Neurologic again she does have significant dementia I do not note any lateralizing findings however cranial nerves appear to be intact she speaks very minimally.  psych again she does have severe dementia she is smiling--does not follow any verbal commands Labs reviewed:  Recent Labs  11/19/15 1739  11/20/15 0515  11/23/15 0534 11/22/2015 0525 11/27/15 0720  NA 160*  < > 158*  < > 144 143 142  K 3.3*  < > 4.4  < > 5.0 3.6 4.1  CL 128*  < > 129*  < > 116* 113* 112*  CO2 25  < > 20*  < > 22 22 22   GLUCOSE 103*  < > 113*  < > 101* 125* 108*  BUN 42*  < > 35*  < > 14 12 22*  CREATININE 1.37*  < > 1.29*  < > 1.01* 0.86 0.86  CALCIUM 8.9  < > 8.9  < > 8.9 8.7* 9.2  MG 2.9*  --  3.3*  --   --   --   --   < > = values in this interval not displayed.  Recent Labs  11/19/15 1116  AST 21  ALT 14  ALKPHOS 88  BILITOT 0.7  PROT 7.7  ALBUMIN 3.7    Recent Labs  11/19/15 1116 11/20/15 1005 11/21/15 0554  11/22/15 0851 11/23/15 0534 11/27/15 0720  WBC 15.8* 9.2 12.3* 7.4 7.6 9.2  NEUTROABS 11.8* 6.3 8.2*  --   --   --   HGB 13.3 7.9* 11.8* 10.9* 11.1* 12.1  HCT 43.7 33.0* 39.0 35.0* 35.3* 38.1  MCV 100.0 99.4 97.3 95.1 93.9 94.5  PLT 257 117* 229 203 196 300   Lab Results  Component Value Date   TSH 2.308 11/19/2015   No results found for: HGBA1C No results found for: CHOL, HDL, LDLCALC, LDLDIRECT, TRIG, CHOLHDL  Significant Diagnostic Results in last 30 days:  No results found.  Assessment/Plan    history of conjunctivitis  At this point appears stable she does  have when necessary gentamicin if needed since she has  frequent recurrences  #2 history of failure to thrive hypernatremia complicated with history of severe dementia-family desires conservative follow-up no labs at this point will monitor clinically she appears to be essentially at her baseline today per exam - Weight at 168 appears to be stabilized.  #3 history of ulcerative colitis she continues on sulfasalazine this is been stable for an extended period of time.  #5 history constipation she is on MiraLAX as well as Colace twice a day also Dulcolax suppository when necessary Apparently this has been stable but was an issue at one time  #6 history of hypertension this appears stable although she is not really on any meds at this point will monitor--recent blood pressures 123/70-124/67  We have been conservative with ordering labs secondary to her husband's wishes-will discuss with her husband about update labs at some point but clinically she appears to be doing well  [id number].

## 2016-07-23 ENCOUNTER — Non-Acute Institutional Stay (SKILLED_NURSING_FACILITY): Payer: Medicare Other | Admitting: Internal Medicine

## 2016-07-23 DIAGNOSIS — R0989 Other specified symptoms and signs involving the circulatory and respiratory systems: Secondary | ICD-10-CM | POA: Diagnosis not present

## 2016-07-23 DIAGNOSIS — R05 Cough: Secondary | ICD-10-CM

## 2016-07-23 DIAGNOSIS — R059 Cough, unspecified: Secondary | ICD-10-CM

## 2016-07-23 NOTE — Progress Notes (Signed)
This is an acute visit.  Level care skilled.  Facility is [facility].  Chief complaint-acute visit secondary to cough.  History of present illness.  Patient is a pleasant elderly female with a history of severe dementia who is been a long-term resident of this facility.  Apparently she  developed a cough the last day or 2 and has some congestion.  It appears she has a low-grade temperature of 99.2 axillary-.  She continues on Claritin for a history of chronic allergic rhinitis  She has severe dementia and cannot give any review of systems however her husband who is very attentive feels she is doing a bit better today in regards to the cough than yesterday-apparently she ate well today and does not appear to be feeling poorly.       Past Medical History:  Diagnosis Date  . Dementia associated with other underlying disease without behavioral disturbance   . Gastroesophageal reflux disease without esophagitis   . Hyperosmolality and hypernatremia    due to dehydration  . Hypertension, essential   . Lack of coordination   . Muscle weakness (generalized)   . Senile arthritis   . Ulcerative colitis, unspecified, without complications (HCC)   . Urinary tract infection, site not specified 10/2015   Proteus UTI        Past Surgical History:  Procedure Laterality Date  . Bladder prolapse surgery  2002  . G3P3    . TONSILLECTOMY           Allergies  Allergen Reactions  . Penicillins     Unknown reaction-Listed on [facility]  Hospitalized 3/23-03/16/2017 she tolerated Keflex for Proteus urinary tract infection without reaction          Current Outpatient Prescriptions on File Prior to Visit  Medication Sig Dispense Refill  . docusate (COLACE) 50 MG/5ML liquid Take 100 mg by mouth 2 (two) times daily. Dilute in 6 oz to 8 oz of juice of milk    . folic acid (FOLVITE) 1 MG tablet Take 1 tablet (1 mg total) by mouth daily.    Marland Kitchen gentamicin (GARAMYCIN)  0.3 % ophthalmic solution Place 2 drops into both eyes 3 (three) times daily. 10 day course starting on May 26-and when necessary after 10 day course is completed    . loratadine (CLARITIN) 10 MG tablet Take 10 mg by mouth daily.    Marland Kitchen omeprazole (PRILOSEC) 20 MG capsule Take 20 mg by mouth every evening.     . polyethylene glycol (MIRALAX / GLYCOLAX) packet Take 17 g by mouth daily.    . sorbitol 70 % solution Take 30 mLs by mouth daily as needed.    . sulfaSALAzine (AZULFIDINE) 500 MG tablet Take 1,000 mg by mouth 3 (three) times daily. After meals     No current facility-administered medications on file prior to visit.     Review of Systems  Essentially unattainable secondary to dementia please see history of present illness       Immunization History  Administered Date(s) Administered  . Influenza-Unspecified 06/04/2014, 05/27/2016  . PPD Test 08/22/2014  . Pneumococcal-Unspecified 06/09/2016       Pertinent  Health Maintenance Due  Topic Date Due  . DEXA SCAN  08/30/2023 (Originally 03/26/1992)  . INFLUENZA VACCINE  Completed  . PNA vac Low Risk Adult  Completed   No flowsheet data found. Functional Status Survey:    Temperature 99.2 axillary pulse 86 respirations 18 blood pressure 129/69 Physical Exam In general this is  a frail female in no distress currently lying in bed  Her skin is warm and dry she does have numerous seborrheic keratosis.  Eyes  Sclera and conjunctiva appear to be clear-visual acuity appears to be grossly intact  Oropharynx was difficult to assess secondary to patient holding her mouth shut.  Chest  Has coarse breath sounds most prominent  upper lung fields no labored breathing  Heart is regular rate and rhythm without murmur gallop or rub she has minimal lower extremity edema.  Abdomen protuberant soft nontender positive bowel sounds.  Musculoskeletal moves all extremities 4 at baseline it appears I do not note  changes other than arthritic.  Neurologic again she does have significant dementia I do not note any lateralizing findings however cranial nerves appear to be intact she speaks very minimally.  psych again she does have severe dementia she is smiling--does not follow any verbal commands Labs reviewed:  Recent Labs (within last 365 days)   Recent Labs  11/19/15 1739  11/20/15 0515  11/23/15 0534 May 12, 2016 0525 11/27/15 0720  NA 160*  < > 158*  < > 144 143 142  K 3.3*  < > 4.4  < > 5.0 3.6 4.1  CL 128*  < > 129*  < > 116* 113* 112*  CO2 25  < > 20*  < > 22 22 22   GLUCOSE 103*  < > 113*  < > 101* 125* 108*  BUN 42*  < > 35*  < > 14 12 22*  CREATININE 1.37*  < > 1.29*  < > 1.01* 0.86 0.86  CALCIUM 8.9  < > 8.9  < > 8.9 8.7* 9.2  MG 2.9*  --  3.3*  --   --   --   --   < > = values in this interval not displayed.    Recent Labs (within last 365 days)   Recent Labs  11/19/15 1116  AST 21  ALT 14  ALKPHOS 88  BILITOT 0.7  PROT 7.7  ALBUMIN 3.7      Recent Labs (within last 365 days)   Recent Labs  11/19/15 1116 11/20/15 1005 11/21/15 0554 11/22/15 0851 11/23/15 0534 11/27/15 0720  WBC 15.8* 9.2 12.3* 7.4 7.6 9.2  NEUTROABS 11.8* 6.3 8.2*  --   --   --   HGB 13.3 7.9* 11.8* 10.9* 11.1* 12.1  HCT 43.7 33.0* 39.0 35.0* 35.3* 38.1  MCV 100.0 99.4 97.3 95.1 93.9 94.5  PLT 257 117* 229 203 196 300     Recent Labs       Lab Results  Component Value Date   TSH 2.308 11/19/2015     Recent Labs  No results found for: HGBA1C   Recent Labs  No results found for: CHOL, HDL, LDLCALC, LDLDIRECT, TRIG, CHOLHDL    Significant Diagnostic Results in last 30 days:  Imaging Results  No results found.   Assessment plan.  Cough with chest congestion-.  Will start Mucinex 600 mg twice a day for 5 days-also add Atrovent nebulizers every 6 hours when necessary for 7 days.  She also continues on Claritin with a history of allergic rhinitis  Monitor  closely vital signs her pulse ox every shift for 48 hours.  I did discuss possibly getting a chest x-ray with her husband--he desires quite conservative care with limited interventions-at this point will monitor if there is any change in status Will reconsider-her husband feels she actually has improved  today compared to yesterday- She has  a low-grade temperature this could be viral but this will have to be watched  [phone]

## 2016-07-25 ENCOUNTER — Encounter: Payer: Self-pay | Admitting: Internal Medicine

## 2016-07-25 ENCOUNTER — Non-Acute Institutional Stay (SKILLED_NURSING_FACILITY): Payer: Medicare Other | Admitting: Internal Medicine

## 2016-07-25 DIAGNOSIS — F0280 Dementia in other diseases classified elsewhere without behavioral disturbance: Secondary | ICD-10-CM | POA: Diagnosis not present

## 2016-07-25 DIAGNOSIS — G309 Alzheimer's disease, unspecified: Secondary | ICD-10-CM | POA: Diagnosis not present

## 2016-07-25 DIAGNOSIS — J069 Acute upper respiratory infection, unspecified: Secondary | ICD-10-CM

## 2016-07-25 NOTE — Progress Notes (Signed)
Location:   [facility] Room Number: 151/P Place of Service:  SNF (31) Provider:  Anjali,Gupta  No primary care provider on file.  Patient Care Team: Mahlon GammonAnjali L Gupta, MD as Consulting Physician (Geriatric Medicine)  Extended Emergency Contact Information Primary Emergency Contact: Leonia ReevesLuking,William  [place] Home Phone: [phone] Mobile Phone: [phone] Relation: Spouse  Code Status:  DNR Goals of care: Advanced Directive information Advanced Directives 07/25/2016  Does Patient Have a Medical Advance Directive? Yes  Type of Advance Directive Out of facility DNR (pink MOST or yellow form)  Does patient want to make changes to medical advance directive? No - Patient declined  Copy of Healthcare Power of Attorney in Chart? -  Pre-existing out of facility DNR order (yellow form or pink MOST form) -     Chief Complaint  Patient presents with  . Acute Visit    HPI:  Pt is a 82 y.o. female seen today for an acute visit for Productive cough with low grade fever.  Patient has history of dementia,ulcerative colitis, Chronic conjunctivitis. UTI and hypernatremia.  Her husband wanted me to evaluate her as patient continues to have cough and low grade fever. Patient was seen by Arlo for same issues but at this time he wanted to be conservative and refused for C Xray or antibiotics. She was started on Mucinex. But patient continues to cough and also has low grade fever. Unable to obtain any other history from her due to h/o Dementia.   Past Medical History:  Diagnosis Date  . Dementia associated with other underlying disease without behavioral disturbance   . Gastroesophageal reflux disease without esophagitis   . Hyperosmolality and hypernatremia    due to dehydration  . Hypertension, essential   . Lack of coordination   . Muscle weakness (generalized)   . Senile arthritis   . Ulcerative colitis, unspecified, without complications (HCC)     . Urinary tract infection, site not specified 10/2015   Proteus UTI   Past Surgical History:  Procedure Laterality Date  . Bladder prolapse surgery  2002  . G3P3    . TONSILLECTOMY      Allergies  Allergen Reactions  . Penicillins     [facility]  Hospitalized 3/23-Oct 22, 2015 she tolerated Keflex for Proteus urinary tract infection without reaction    Current Outpatient Prescriptions on File Prior to Visit  Medication Sig Dispense Refill  . docusate (COLACE) 50 MG/5ML liquid Take 100 mg by mouth 2 (two) times daily. Dilute in 6 oz to 8 oz of juice of milk    . folic acid (FOLVITE) 1 MG tablet Take 1 tablet (1 mg total) by mouth daily.    Marland Kitchen. gentamicin (GARAMYCIN) 0.3 % ophthalmic solution Place 2 drops into both eyes 3 (three) times daily. 10 day course starting on May 26-and when necessary after 10 day course is completed    . loratadine (CLARITIN) 10 MG tablet Take 10 mg by mouth daily.    Marland Kitchen. omeprazole (PRILOSEC) 20 MG capsule Take 20 mg by mouth every evening.     . polyethylene glycol (MIRALAX / GLYCOLAX) packet Take 17 g by mouth daily.    . sorbitol 70 % solution Take 30 mLs by mouth daily as needed.    . sulfaSALAzine (AZULFIDINE) 500 MG tablet Take 1,000 mg by mouth 3 (three) times daily. After meals     No current facility-administered medications on file prior to visit.     Review of  Systems  Reason unable to perform ROS: Unable to obtain but per husband she has productive cough.    Immunization History  Administered Date(s) Administered  . Influenza-Unspecified 06/04/2014, 05/27/2016  . PPD Test 08/22/2014  . Pneumococcal-Unspecified 06/09/2016   Pertinent  Health Maintenance Due  Topic Date Due  . DEXA SCAN  08/30/2023 (Originally 03/26/1992)  . INFLUENZA VACCINE  Completed  . PNA vac Low Risk Adult  Completed   No flowsheet data found. Functional Status Survey:    Vitals:   07/25/16 1450  BP: 110/76  Pulse: 76  Resp: 20  Temp: 99.6  F (37.6 C)  TempSrc: Oral   There is no height or weight on file to calculate BMI. Physical Exam  Constitutional: She appears well-developed and well-nourished.  HENT:  Head: Normocephalic.  Cardiovascular: Normal rate, regular rhythm and normal heart sounds.   Pulmonary/Chest: Effort normal and breath sounds normal. No respiratory distress. She has no wheezes. She has no rales. She exhibits no tenderness.  Abdominal: Soft. Bowel sounds are normal. She exhibits no distension. There is no tenderness. There is no rebound and no guarding.  Musculoskeletal: She exhibits no edema.  Neurological: She is alert.    Labs reviewed:  Recent Labs  11/19/15 1739  11/20/15 0515  11/23/15 0534 11/25/2015 0525 11/27/15 0720  NA 160*  < > 158*  < > 144 143 142  K 3.3*  < > 4.4  < > 5.0 3.6 4.1  CL 128*  < > 129*  < > 116* 113* 112*  CO2 25  < > 20*  < > 22 22 22   GLUCOSE 103*  < > 113*  < > 101* 125* 108*  BUN 42*  < > 35*  < > 14 12 22*  CREATININE 1.37*  < > 1.29*  < > 1.01* 0.86 0.86  CALCIUM 8.9  < > 8.9  < > 8.9 8.7* 9.2  MG 2.9*  --  3.3*  --   --   --   --   < > = values in this interval not displayed.  Recent Labs  11/19/15 1116  AST 21  ALT 14  ALKPHOS 88  BILITOT 0.7  PROT 7.7  ALBUMIN 3.7    Recent Labs  11/19/15 1116 11/20/15 1005 11/21/15 0554 11/22/15 0851 11/23/15 0534 11/27/15 0720  WBC 15.8* 9.2 12.3* 7.4 7.6 9.2  NEUTROABS 11.8* 6.3 8.2*  --   --   --   HGB 13.3 7.9* 11.8* 10.9* 11.1* 12.1  HCT 43.7 33.0* 39.0 35.0* 35.3* 38.1  MCV 100.0 99.4 97.3 95.1 93.9 94.5  PLT 257 117* 229 203 196 300   Lab Results  Component Value Date   TSH 2.308 11/19/2015   No results found for: HGBA1C No results found for: CHOL, HDL, LDLCALC, LDLDIRECT, TRIG, CHOLHDL  Significant Diagnostic Results in last 30 days:  No results found.  Assessment/Plan  Upper respiratory Tract infection  Will start her on Zithromax 500 mg QD for 5 days. Continue to monitor. No  Blood work right now as per Family wishes.   Family/ staff Communication:   Labs/tests ordered:

## 2016-09-02 ENCOUNTER — Encounter: Payer: Self-pay | Admitting: Internal Medicine

## 2016-09-02 ENCOUNTER — Non-Acute Institutional Stay (SKILLED_NURSING_FACILITY): Payer: Medicare Other | Admitting: Internal Medicine

## 2016-09-02 DIAGNOSIS — R059 Cough, unspecified: Secondary | ICD-10-CM

## 2016-09-02 DIAGNOSIS — R1111 Vomiting without nausea: Secondary | ICD-10-CM

## 2016-09-02 DIAGNOSIS — R05 Cough: Secondary | ICD-10-CM | POA: Diagnosis not present

## 2016-09-02 NOTE — Progress Notes (Signed)
Location:    Nursing Home Room Number: 151/P Place of Service:  SNF (31) Provider:  Edmon Crape  No primary care provider on file.  Patient Care Team: Mahlon Gammon, MD as Consulting Physician (Geriatric Medicine)  Extended Emergency Contact Information Primary Emergency Contact: Leonia Reeves [place] Home Phone: [phone] Mobile Phone: [phone] Relation: Spouse  Goals of care: Advanced Directive information Advanced Directives 09/02/2016  Does Patient Have a Medical Advance Directive? Yes  Type of Advance Directive Out of facility DNR (pink MOST or yellow form)  Does patient want to make changes to medical advance directive? No - Patient declined  Copy of Healthcare Power of Attorney in Chart? -  Pre-existing out of facility DNR order (yellow form or pink MOST form) -     Chief Complaint  Patient presents with  . Acute Visit    Cough and Vomiting X1     HPI:  Pt is a 83 y.o. female seen today for an acute visit forFollow-up of cough-apparently earlier this morning she was coughing and then vomited it was thought this was secondary to her coughing.  She has severe dementia and cannot really give any review of systems.  She was treated approximately a  month ago for suspected URI she did receive a dose of Rocephin and did complete a course of azithromycin.      Past Medical History:  Diagnosis Date  . Dementia associated with other underlying disease without behavioral disturbance   . Gastroesophageal reflux disease without esophagitis   . Hyperosmolality and hypernatremia    due to dehydration  . Hypertension, essential   . Lack of coordination   . Muscle weakness (generalized)   . Senile arthritis   . Ulcerative colitis, unspecified, without complications (HCC)   . Urinary tract infection, site not specified 10/2015   Proteus UTI   Past Surgical History:  Procedure Laterality Date  . Bladder prolapse surgery  2002  . G3P3    .  TONSILLECTOMY      Allergies  Allergen Reactions  . Penicillins     Unknown reaction-Listed on [facility]  Hospitalized 3/23-03/22/2017 she tolerated Keflex for Proteus urinary tract infection without reaction    Current Outpatient Prescriptions on File Prior to Visit  Medication Sig Dispense Refill  . Dextromethorphan-Guaifenesin (ROBITUSSIN COUGH+CHEST CONG DM) 5-100 MG/5ML LIQD Give 15 cc by mouth every 6 hours prn cough    . docusate (COLACE) 50 MG/5ML liquid Take 100 mg by mouth 2 (two) times daily. Dilute in 6 oz to 8 oz of juice of milk    . folic acid (FOLVITE) 1 MG tablet Take 1 tablet (1 mg total) by mouth daily.    Marland Kitchen gentamicin (GARAMYCIN) 0.3 % ophthalmic solution Place 2 drops into both eyes 3 (three) times daily. 10 day course starting on May 26-and when necessary after 10 day course is completed    . loratadine (CLARITIN) 10 MG tablet Take 10 mg by mouth daily.    Marland Kitchen omeprazole (PRILOSEC) 20 MG capsule Take 20 mg by mouth every evening.     . polyethylene glycol (MIRALAX / GLYCOLAX) packet Take 17 g by mouth daily.    . sorbitol 70 % solution Take 30 mLs by mouth daily as needed.    . sulfaSALAzine (AZULFIDINE) 500 MG tablet Take 1,000 mg by mouth 3 (three) times daily. After meals     No current facility-administered medications on file prior to visit.     Review of Systems  Unattainable secondary to dementia    Immunization History  Administered Date(s) Administered  . Influenza-Unspecified 06/04/2014, 05/27/2016  . PPD Test 08/22/2014  . Pneumococcal-Unspecified 06/09/2016   Pertinent  Health Maintenance Due  Topic Date Due  . DEXA SCAN  08/30/2023 (Originally 03/26/1992)  . INFLUENZA VACCINE  Completed  . PNA vac Low Risk Adult  Completed   No flowsheet data found. Functional Status Survey:    Vitals:   09/02/16 1149  BP: 119/74  Pulse: 68  Resp: 18  Temp: 98.6 F (37 C)  TempSrc: Oral    Physical Exam   In general this is a frail elderly female in  no distress she has significant dementia and is largely noncompetitive she is humming which she tends to do frequently.  Her skin is warm and dry has numerous seborrheic keratosis.  Oropharynx difficult exam since she did not really open her mouth but what I could see mucous membranes appear to be moist throat clear.  Eyes sclera and conjunctiva are clear.  Chest poor respiratory effort she does not follow verbal commands but I could not really appreciate overt congestion no labored breathing.  Heart is regular rate and rhythm without murmur gallop or rub.  Abdomen is soft and there are active bowel sounds appears to be nontender.  Musculoskeletal does move her extremities 4 with lower extremity weakness  Neurologic could not really appreciate any lateralizing findings cranial nerves appear to be intact has minimal speech.  Psych as noted above she has severe dementia  Labs reviewed:  Recent Labs  11/19/15 1739  11/20/15 0515  11/23/15 0534 10/30/2015 0525 11/27/15 0720  NA 160*  < > 158*  < > 144 143 142  K 3.3*  < > 4.4  < > 5.0 3.6 4.1  CL 128*  < > 129*  < > 116* 113* 112*  CO2 25  < > 20*  < > 22 22 22   GLUCOSE 103*  < > 113*  < > 101* 125* 108*  BUN 42*  < > 35*  < > 14 12 22*  CREATININE 1.37*  < > 1.29*  < > 1.01* 0.86 0.86  CALCIUM 8.9  < > 8.9  < > 8.9 8.7* 9.2  MG 2.9*  --  3.3*  --   --   --   --   < > = values in this interval not displayed.  Recent Labs  11/19/15 1116  AST 21  ALT 14  ALKPHOS 88  BILITOT 0.7  PROT 7.7  ALBUMIN 3.7    Recent Labs  11/19/15 1116 11/20/15 1005 11/21/15 0554 11/22/15 0851 11/23/15 0534 11/27/15 0720  WBC 15.8* 9.2 12.3* 7.4 7.6 9.2  NEUTROABS 11.8* 6.3 8.2*  --   --   --   HGB 13.3 7.9* 11.8* 10.9* 11.1* 12.1  HCT 43.7 33.0* 39.0 35.0* 35.3* 38.1  MCV 100.0 99.4 97.3 95.1 93.9 94.5  PLT 257 117* 229 203 196 300   Lab Results  Component Value Date   TSH 2.308 11/19/2015   No results found for: HGBA1C No  results found for: CHOL, HDL, LDLCALC, LDLDIRECT, TRIG, CHOLHDL  Significant Diagnostic Results in last 30 days:  No results found.  Assessment/Plan  Cough with vomiting episode-apparently this was more of a one time instance-I did have an extensive discussion with her husband at bedside-he has not really noted any cough beyond baseline-she did eat some lunch this noon without incident-her husband wishes conservative measures-at this point will  continue to monitor for any further instances-exam was fairly benign will monitor clinically.  [id number]-of note greater than 25 minutes spent assessing patient-discussing her status with nursing staff-watching her eat lunch-and discussing her status with her husband at bedside.  And formulating a plan of care            London SheerLuster, Sally C, [place] [phone]

## 2016-09-13 ENCOUNTER — Non-Acute Institutional Stay (SKILLED_NURSING_FACILITY): Payer: Medicare Other | Admitting: Internal Medicine

## 2016-09-13 ENCOUNTER — Encounter: Payer: Self-pay | Admitting: Internal Medicine

## 2016-09-13 DIAGNOSIS — R5381 Other malaise: Secondary | ICD-10-CM | POA: Diagnosis not present

## 2016-09-13 DIAGNOSIS — R1111 Vomiting without nausea: Secondary | ICD-10-CM

## 2016-09-13 NOTE — Progress Notes (Signed)
Location:   [facility] Room Number: 151/P Place of Service:  SNF (31) Provider:  Edmon Crape  No primary care provider on file.  Patient Care Team: Mahlon Gammon, MD as Consulting Physician (Geriatric Medicine)  Extended Emergency Contact Information Primary Emergency Contact: Leonia Reeves [place] Home Phone: [phone] Mobile Phone: [phone] Relation: Spouse  Code Status:  DNR Goals of care: Advanced Directive information Advanced Directives 09/13/2016  Does Patient Have a Medical Advance Directive? Yes  Type of Advance Directive Out of facility DNR (pink MOST or yellow form)  Does patient want to make changes to medical advance directive? No - Patient declined  Copy of Healthcare Power of Attorney in Chart? -  Pre-existing out of facility DNR order (yellow form or pink MOST form) -    Chief complaint-acute visit secondary to suspicions patient not feeling well   HPI:  Pt is a 83 y.o. female seen today for an acute visit for concerns she has not been feeling well.  Patient has been a long-term resident of facility and does have a history of significant dementia as well as at times failure to thrive issues although this has been stable recently.  She also at times has UTI-respiratory issues-and conjunctivitis-.  Her husband states that yesterday apparently she had a vomiting episode after eating and appeared not to be feeling well.  Apparently today is a better day however-appetite was not good earlier in the day but apparently at supper this evening she took her supplements quite well no was no vomiting.  Vital signs were stable apparently yesterday at one point she was thought to be tachycardic with a pulse of around 110-I got approximately 100 tonight-she is afebrile she appears to be at her baseline she does not really talk-but does appear to be alert and at her baseline this evening.  In the past husband has not really  wanted aggressive workups.  At this point she appears to be stable -she is borderline tachycardic but does not really give overt signs of of any sepsis or distress   Past Medical History:  Diagnosis Date  . Dementia associated with other underlying disease without behavioral disturbance   . Gastroesophageal reflux disease without esophagitis   . Hyperosmolality and hypernatremia    due to dehydration  . Hypertension, essential   . Lack of coordination   . Muscle weakness (generalized)   . Senile arthritis   . Ulcerative colitis, unspecified, without complications (HCC)   . Urinary tract infection, site not specified 10/2015   Proteus UTI   Past Surgical History:  Procedure Laterality Date  . Bladder prolapse surgery  2002  . G3P3    . TONSILLECTOMY      Allergies  Allergen Reactions  . Penicillins     Unknown reaction-Listed on [facility]  Hospitalized 3/23-03/01/2016 she tolerated Keflex for Proteus urinary tract infection without reaction    Current Outpatient Prescriptions on File Prior to Visit  Medication Sig Dispense Refill  . Dextromethorphan-Guaifenesin (ROBITUSSIN COUGH+CHEST CONG DM) 5-100 MG/5ML LIQD Give 15 cc by mouth every 6 hours prn cough    . docusate (COLACE) 50 MG/5ML liquid Take 100 mg by mouth 2 (two) times daily. Dilute in 6 oz to 8 oz of juice of milk    . folic acid (FOLVITE) 1 MG tablet Take 1 tablet (1 mg total) by mouth daily.    Marland Kitchen gentamicin (GARAMYCIN) 0.3 % ophthalmic solution Place 2 drops into both eyes 3 (three)  times daily. 10 day course starting on May 26-and when necessary after 10 day course is completed    . loratadine (CLARITIN) 10 MG tablet Take 10 mg by mouth daily.    Marland Kitchen. omeprazole (PRILOSEC) 20 MG capsule Take 20 mg by mouth every evening.     . polyethylene glycol (MIRALAX / GLYCOLAX) packet Take 17 g by mouth daily.    . sorbitol 70 % solution Take 30 mLs by mouth daily as needed.    . sulfaSALAzine (AZULFIDINE) 500 MG tablet Take 1,000 mg  by mouth 3 (three) times daily. After meals     No current facility-administered medications on file prior to visit.      Review of Systems   Essentially unattainable secondary to dementia there is not really been increased cough noted by staff-or any sign of distress did have a vomiting episode yesterday but apparently has been stable in this regards to day Apparently she is having fairly regular bowel movements  Immunization History  Administered Date(s) Administered  . Influenza-Unspecified 06/04/2014, 05/27/2016  . PPD Test 08/22/2014  . Pneumococcal-Unspecified 06/09/2016   Pertinent  Health Maintenance Due  Topic Date Due  . DEXA SCAN  08/30/2023 (Originally 03/26/1992)  . INFLUENZA VACCINE  Completed  . PNA vac Low Risk Adult  Completed   No flowsheet data found. Functional Status Survey:    Vitals:   09/13/16 0937  BP: 110/68  Pulse: 74  Resp: 16  Temp: 98.2 F (36.8 C)  TempSrc: Oral  Apical pulse on exam was 100  Physical Exam    In general this is a frail elderly female in no distress she has significant dementia and is largely noncompetitive but she is sitting in her wheelchair comfortably is alert  Her skin is warm and dry has numerous seborrheic keratosis. Skin turgor does not be appear grossly impaired  Oropharynx difficult exam since she did not really open her mouth and does not respond to verbal commands  Eyes sclera and conjunctiva are clear. From what I can tell although difficult exam since she tends to close her eyelids  when attempts are made to examine her eyes  Chest poor respiratory effort she does not follow verbal commands but I could not really appreciate overt congestion no labored breathing.  Heart is regular rate and rhythm--borderline tachycardic at 100- without murmur gallop or rub.  Abdomen is soft and there are active bowel sounds appears to be nontender.  GU could not really appreciate suprapubic  tenderness  Musculoskeletal does move her extremities 4 with lower extremity weakness  Neurologic could not really appreciate any lateralizing findings cranial nerves appear to be intact has minimal speech.  Psych as noted above she has severe dementia   Labs reviewed:  Recent Labs  11/19/15 1739  11/20/15 0515  11/23/15 0534 2016/08/06 0525 11/27/15 0720  NA 160*  < > 158*  < > 144 143 142  K 3.3*  < > 4.4  < > 5.0 3.6 4.1  CL 128*  < > 129*  < > 116* 113* 112*  CO2 25  < > 20*  < > 22 22 22   GLUCOSE 103*  < > 113*  < > 101* 125* 108*  BUN 42*  < > 35*  < > 14 12 22*  CREATININE 1.37*  < > 1.29*  < > 1.01* 0.86 0.86  CALCIUM 8.9  < > 8.9  < > 8.9 8.7* 9.2  MG 2.9*  --  3.3*  --   --   --   --   < > =  values in this interval not displayed.  Recent Labs  11/19/15 1116  AST 21  ALT 14  ALKPHOS 88  BILITOT 0.7  PROT 7.7  ALBUMIN 3.7    Recent Labs  11/19/15 1116 11/20/15 1005 11/21/15 0554 11/22/15 0851 11/23/15 0534 11/27/15 0720  WBC 15.8* 9.2 12.3* 7.4 7.6 9.2  NEUTROABS 11.8* 6.3 8.2*  --   --   --   HGB 13.3 7.9* 11.8* 10.9* 11.1* 12.1  HCT 43.7 33.0* 39.0 35.0* 35.3* 38.1  MCV 100.0 99.4 97.3 95.1 93.9 94.5  PLT 257 117* 229 203 196 300   Lab Results  Component Value Date   TSH 2.308 11/19/2015   No results found for: HGBA1C No results found for: CHOL, HDL, LDLCALC, LDLDIRECT, TRIG, CHOLHDL  Significant Diagnostic Results in last 30 days:  No results found.  Assessment/Plan  #1-episode of vomiting yesterday and patient not feeling well apparently yesterday-she appears to be more at her baseline today per discussion with staff as well as with her husband did not eat well earlier today but apparently later this evening took her supplements well and appeared to be improving.  Her husband does not really want aggressive interventions generally and this appears to be the case this evening as well-at this point will monitor with vital signs and  pulse ox every shift for now-this may have been a passing virus-but will have to be watched.  She does not appear to be overtly septic or dehydrated here but her pulse will also have to be monitored --   [id number]   London Sheer, CMA [phone]

## 2016-09-19 ENCOUNTER — Non-Acute Institutional Stay (SKILLED_NURSING_FACILITY): Payer: Medicare Other | Admitting: Internal Medicine

## 2016-09-19 ENCOUNTER — Encounter: Payer: Self-pay | Admitting: Internal Medicine

## 2016-09-19 DIAGNOSIS — F0280 Dementia in other diseases classified elsewhere without behavioral disturbance: Secondary | ICD-10-CM

## 2016-09-19 DIAGNOSIS — R4182 Altered mental status, unspecified: Secondary | ICD-10-CM | POA: Diagnosis not present

## 2016-09-19 DIAGNOSIS — G309 Alzheimer's disease, unspecified: Secondary | ICD-10-CM

## 2016-09-19 DIAGNOSIS — R627 Adult failure to thrive: Secondary | ICD-10-CM

## 2016-09-19 NOTE — Progress Notes (Signed)
Location:   [facility] Room Number: 151/P Place of Service:  SNF (31) Provider:  Edmon CrapeArlo Jadesola Poynter  No primary care provider on file.  Patient Care Team: Mahlon GammonAnjali L Gupta, MD as Consulting Physician (Geriatric Medicine)  Extended Emergency Contact Information Primary Emergency Contact: Leonia ReevesLuking,William  [place] Home Phone: [phone] Mobile Phone: [phone] Relation: Spouse  Code Status:  DNR Goals of care: Advanced Directive information Advanced Directives 09/19/2016  Does Patient Have a Medical Advance Directive? Yes  Type of Advance Directive Out of facility DNR (pink MOST or yellow form)  Does patient want to make changes to medical advance directive? No - Patient declined  Copy of Healthcare Power of Attorney in Chart? -  Pre-existing out of facility DNR order (yellow form or pink MOST form) -     Chief complaint-acute visit secondary to change in status  HPI:  Pt is a 83 y.o. female seen today for an acute visit for change in status.  Patient has severe dementia and has gradually been eating and drinking less-however her husband is concerned today saying she appears at times to be somewhat uncomfortable with increased body movements grimacing.  I did see her last week after a vomiting episode but apparently this resolved fairly unremarkably although her appetite is not really picked up much.  In fact at one point she had been hospitalized for hypernatremia secondary to failure to thrive but this did turn around fairly significantly.  Recently she appears to have some decline  Her husband is concerned that possibly she may be having an ear infection or possibly her teeth may be causing her some discomfort.  She is largely edentulous but she does have some remaining teeth lower mouth which appear largely decayed.  Her vital signs are stable she is afebrile      Past Medical History:  Diagnosis Date  . Dementia associated with  other underlying disease without behavioral disturbance   . Gastroesophageal reflux disease without esophagitis   . Hyperosmolality and hypernatremia    due to dehydration  . Hypertension, essential   . Lack of coordination   . Muscle weakness (generalized)   . Senile arthritis   . Ulcerative colitis, unspecified, without complications (HCC)   . Urinary tract infection, site not specified 10/2015   Proteus UTI   Past Surgical History:  Procedure Laterality Date  . Bladder prolapse surgery  2002  . G3P3    . TONSILLECTOMY      Allergies  Allergen Reactions  . Penicillins     [facility]  Hospitalized 3/23-03/13/2017 she tolerated Keflex for Proteus urinary tract infection without reaction    Current Outpatient Prescriptions on File Prior to Visit  Medication Sig Dispense Refill  . Dextromethorphan-Guaifenesin (ROBITUSSIN COUGH+CHEST CONG DM) 5-100 MG/5ML LIQD Give 15 cc by mouth every 6 hours prn cough    . docusate (COLACE) 50 MG/5ML liquid Take 100 mg by mouth 2 (two) times daily. Dilute in 6 oz to 8 oz of juice of milk    . folic acid (FOLVITE) 1 MG tablet Take 1 tablet (1 mg total) by mouth daily.    Marland Kitchen. gentamicin (GARAMYCIN) 0.3 % ophthalmic solution Place 2 drops into both eyes 3 (three) times daily. 10 day course starting on May 26-and when necessary after 10 day course is completed    . loratadine (CLARITIN) 10 MG tablet Take 10 mg by mouth daily.    Marland Kitchen. omeprazole (PRILOSEC) 20 MG capsule Take 20  mg by mouth every evening.     . polyethylene glycol (MIRALAX / GLYCOLAX) packet Take 17 g by mouth daily.    . sorbitol 70 % solution Take 30 mLs by mouth daily as needed.    . sulfaSALAzine (AZULFIDINE) 500 MG tablet Take 1,000 mg by mouth 3 (three) times daily. After meals     No current facility-administered medications on file prior to visit.      Review of Systems   Unattainable secondary to severe dementia please see history of present  illness  Immunization History  Administered Date(s) Administered  . Influenza-Unspecified 06/04/2014, 05/27/2016  . PPD Test 08/22/2014  . Pneumococcal-Unspecified 06/09/2016   Pertinent  Health Maintenance Due  Topic Date Due  . DEXA SCAN  08/30/2023 (Originally 03/26/1992)  . INFLUENZA VACCINE  Completed  . PNA vac Low Risk Adult  Completed   No flowsheet data found. Functional Status Survey:    Vitals:   09/19/16 1056  BP: (!) 110/58  Pulse: 82  Resp: (!) 22  Temp: 98.4 F (36.9 C)  TempSrc: Oral  Weight is 153 pounds  Physical Exam  In general this is a frail LE female initially when I saw him did not appear to have any distress she does have coming episodes which she was doing she appeared to be comfortable.  I did reevaluate her later after husband stated she was having another episode which he had seen over the weekend where apparently she is moving her body somewhat more forcefully in her wheelchair and she did appear to be doing this-- difficult to see if this was more pain related or possibly neurologic with her progressing dementia  Oropharynx-she did close her lips quite tightly but was able to do a short evaluation of her mouth she does have significant decay lower front teeth otherwise she is largely edentulous  Ears-bilaterally I did no wax buildup-I could not really appreciate any erythema or discharge Tympanic membranes were difficult to visualize bilaterally.  Chest she had poor respiratory effort does not follow verbal commands but could not appreciate any overt congestion no sign of respiratory distress or labored breathing.  Heart is regular rate and rhythm borderline tachycardic at between 96 and 100 she does not have significant lower extremity edema.  Her abdomen soft does not appear to be acutely tender with positive bowel sounds.  GU could not really appreciate suprapubic tenderness  Musculoskeletal does hold her left arm and a somewhat  contracted position-does have lower extremity weakness which is not new again she is somewhat moving her torso in the wheelchair twisting a bit.  Neurologic as noted above I could not really appreciate any lateralizing findings cranial nerves appear grossly intact she is able to shut her eyes quite tightly as well as her mouth.  Psych she does have findings consistent with severe dementia is not really responding to verbal commands-she is alert Labs reviewed:  Recent Labs  11/19/15 1739  11/20/15 0515  11/23/15 0534 11/01/2015 0525 11/27/15 0720  NA 160*  < > 158*  < > 144 143 142  K 3.3*  < > 4.4  < > 5.0 3.6 4.1  CL 128*  < > 129*  < > 116* 113* 112*  CO2 25  < > 20*  < > 22 22 22   GLUCOSE 103*  < > 113*  < > 101* 125* 108*  BUN 42*  < > 35*  < > 14 12 22*  CREATININE 1.37*  < > 1.29*  < >  1.01* 0.86 0.86  CALCIUM 8.9  < > 8.9  < > 8.9 8.7* 9.2  MG 2.9*  --  3.3*  --   --   --   --   < > = values in this interval not displayed.  Recent Labs  11/19/15 1116  AST 21  ALT 14  ALKPHOS 88  BILITOT 0.7  PROT 7.7  ALBUMIN 3.7    Recent Labs  11/19/15 1116 11/20/15 1005 11/21/15 0554 11/22/15 0851 11/23/15 0534 11/27/15 0720  WBC 15.8* 9.2 12.3* 7.4 7.6 9.2  NEUTROABS 11.8* 6.3 8.2*  --   --   --   HGB 13.3 7.9* 11.8* 10.9* 11.1* 12.1  HCT 43.7 33.0* 39.0 35.0* 35.3* 38.1  MCV 100.0 99.4 97.3 95.1 93.9 94.5  PLT 257 117* 229 203 196 300   Lab Results  Component Value Date   TSH 2.308 11/19/2015   No results found for: HGBA1C No results found for: CHOL, HDL, LDLCALC, LDLDIRECT, TRIG, CHOLHDL  Significant Diagnostic Results in last 30 days:  No results found.  Assessment/Plan #1 change in  status with history of progressing dementia-question dental issues-area issues.  Ear exam appear to be fairly benign at this point her husband does not wish for aggressive treatment of the wax buildup.  Cannot rule out dental issues here-her husband does not want aggressive  treatment of this or a dental consult.  At times she has responded well to Rocephin for a multitude of possible infections-will empirically start Rocephin for 2 days 1 g IM to see if this will help-again there is significant suspicion this may be related to her progressing dementia with poor by mouth intake and weight loss she has lost about 8 pounds over the past month.  Her husband is aware of this and essentially wants more comfort noninvasive procedures.  At this point does not really want further labs we also discussed possibly obtaining a urine culture but will hold off for now.  Clinically she does not appear to be unstable her vital signs actually appear to be fairly stable but her status gradually appears to be declining.  Regards to possible pain will write an order for Tylenol 650 mg twice a day routine to see if this may help as well.    [id number] note greater than 35 minutes spent assessing patient-and reassessing patient--discussion with her husband about patient's status and plan of care-and coordinating and formulating a plan of care.        London Sheer, [place] [phone]

## 2016-09-21 ENCOUNTER — Non-Acute Institutional Stay (SKILLED_NURSING_FACILITY): Payer: Medicare Other | Admitting: Internal Medicine

## 2016-09-21 ENCOUNTER — Encounter: Payer: Self-pay | Admitting: Internal Medicine

## 2016-09-21 DIAGNOSIS — G309 Alzheimer's disease, unspecified: Secondary | ICD-10-CM | POA: Diagnosis not present

## 2016-09-21 DIAGNOSIS — R627 Adult failure to thrive: Secondary | ICD-10-CM | POA: Diagnosis not present

## 2016-09-21 DIAGNOSIS — F0280 Dementia in other diseases classified elsewhere without behavioral disturbance: Secondary | ICD-10-CM | POA: Diagnosis not present

## 2016-09-21 NOTE — Progress Notes (Signed)
Location:   [facility] Room Number: 151/P Place of Service:  SNF (31) Provider:  Edmon CrapeArlo Yeng Perz  No primary care provider on file.  Patient Care Team: Mahlon GammonAnjali L Gupta, MD as Consulting Physician (Geriatric Medicine)  Extended Emergency Contact Information Primary Emergency Contact: Leonia ReevesLuking,William  [place] Home Phone: [phone] Mobile Phone: [phone] Relation: Spouse  Code Status:  DNR Goals of care: Advanced Directive information Advanced Directives 09/21/2016  Does Patient Have a Medical Advance Directive? Yes  Type of Advance Directive Out of facility DNR (pink MOST or yellow form)  Does patient want to make changes to medical advance directive? No - Patient declined  Copy of Healthcare Power of Attorney in Chart? -  Pre-existing out of facility DNR order (yellow form or pink MOST form) -     Chief Complaint  Patient presents with  . Acute Visit    Failure to thrive     HPI:  Pt is a 83 y.o. female seen today for an acute visit for Continuing failure to thrive issues with a history of end-stage dementia.  She has gradually had a deterioration in her by mouth intake-but now is hardly taking in anything.  I did see her earlier this week secondary to concerns that she appeared be somewhat uncomfortable per her husband's report who is very attentive.  We did start her on empiric Rocephin since she has responded at times to this when she is had an infection-her husband does not really want any aggressive interventions like lab work or urinalysis or x-rays.  However she's had 2 days of Rocephin and does not appear to be responding to this.  We did start Tylenol routinely to help with any discomfort issues but apparently at night she does not really swallow so we will need to change this to suppository form.  Currently she is resting in her Geri chair she appears to be comfortable at times will move her head from side to side-does  not really respond however to any verbal commands which is not new since she does have significant dementia.  She does not appear to be in distress but does appear to have significant decline here.  I did speak with her husband today as well as with her daughter-at this point her husband  would like to continue Rocephin for a couple more days and we will do that-however prognosis continues to be poor and we will order a hospice consult which her husband is in agreement with.         Past Medical History:  Diagnosis Date  . Dementia associated with other underlying disease without behavioral disturbance   . Gastroesophageal reflux disease without esophagitis   . Hyperosmolality and hypernatremia    due to dehydration  . Hypertension, essential   . Lack of coordination   . Muscle weakness (generalized)   . Senile arthritis   . Ulcerative colitis, unspecified, without complications (HCC)   . Urinary tract infection, site not specified 10/2015   Proteus UTI   Past Surgical History:  Procedure Laterality Date  . Bladder prolapse surgery  2002  . G3P3    . TONSILLECTOMY      Allergies  Allergen Reactions  . Penicillins     [facility]  Hospitalized 3/23-03/29/2017 she tolerated Keflex for Proteus urinary tract infection without reaction    Current Outpatient Prescriptions on File Prior to Visit  Medication Sig Dispense Refill  . Dextromethorphan-Guaifenesin (ROBITUSSIN COUGH+CHEST CONG DM) 5-100  MG/5ML LIQD Give 15 cc by mouth every 6 hours prn cough    . docusate (COLACE) 50 MG/5ML liquid Take 100 mg by mouth 2 (two) times daily. Dilute in 6 oz to 8 oz of juice of milk    . folic acid (FOLVITE) 1 MG tablet Take 1 tablet (1 mg total) by mouth daily.    Marland Kitchen gentamicin (GARAMYCIN) 0.3 % ophthalmic solution Place 2 drops into both eyes 3 (three) times daily. 10 day course starting on May 26-and when necessary after 10 day course is completed    . loratadine  (CLARITIN) 10 MG tablet Take 10 mg by mouth daily.    Marland Kitchen omeprazole (PRILOSEC) 20 MG capsule Take 20 mg by mouth every evening.     . polyethylene glycol (MIRALAX / GLYCOLAX) packet Take 17 g by mouth daily.    . sorbitol 70 % solution Take 30 mLs by mouth daily as needed.    . sulfaSALAzine (AZULFIDINE) 500 MG tablet Take 1,000 mg by mouth 3 (three) times daily. After meals     No current facility-administered medications on file prior to visit.    She also has Tylenol 650 mg twice a day  Review of Systems   Unattainable secondary to dementia please see history of present illness  Immunization History  Administered Date(s) Administered  . Influenza-Unspecified 06/04/2014, 05/27/2016  . PPD Test 08/22/2014  . Pneumococcal-Unspecified 06/09/2016   Pertinent  Health Maintenance Due  Topic Date Due  . DEXA SCAN  08/30/2023 (Originally [birth year]1994)  . INFLUENZA VACCINE  Completed  . PNA vac Low Risk Adult  Completed   No flowsheet data found. Functional Status Survey:    Vitals:   09/21/16 1113  BP: 108/88  Pulse: 75  Resp: 20  Temp: 97.6 F (36.4 C)  TempSrc: Oral    Physical Exam   In general this is a frail elderly female lying in [place] chair her eyes are closed but she does appear to be responsive to any invasive maneuver and at times will open her eyes and move her head back and forth.  She does not appear to be in any distress.  Her skin is warm and dry she does have numerous seborrheic keratosis which are not new.  Oropharynx was difficult to assess secondary patient closing her mouth quite tightly.  Eyes limited exam but I could not appreciate any exudate or erythema.  Chest she did not really follow verbal commands but I could not appreciate any overt congestion no sign of respiratory distress or labored breathing.  Heart is regular rate and rhythm in the 90s she does not really have significant lower extremity edema.  Abdomen is soft does not appear to be  tender there are active bowel sounds.  GU could not really appreciate overt suprapubic tenderness.  .  Musculoskeletal does hold her left arm and a somewhat contracted position has lower extremity weakness which is not new I do not really see any changes from baseline.  Neurologic she is responsive to stimuli will open her eyes at times and move her head back and forth and sideways.  I do not see any lateralizing findings although again this is limited secondary patient not really following verbal commands with her severe dementia.    Labs reviewed:  Recent Labs  11/19/15 1739  11/20/15 0515  11/23/15 0534 Dec 08, 2015 0525 11/27/15 0720  NA 160*  < > 158*  < > 144 143 142  K 3.3*  < > 4.4  < >  5.0 3.6 4.1  CL 128*  < > 129*  < > 116* 113* 112*  CO2 25  < > 20*  < > 22 22 22   GLUCOSE 103*  < > 113*  < > 101* 125* 108*  BUN 42*  < > 35*  < > 14 12 22*  CREATININE 1.37*  < > 1.29*  < > 1.01* 0.86 0.86  CALCIUM 8.9  < > 8.9  < > 8.9 8.7* 9.2  MG 2.9*  --  3.3*  --   --   --   --   < > = values in this interval not displayed.  Recent Labs  11/19/15 1116  AST 21  ALT 14  ALKPHOS 88  BILITOT 0.7  PROT 7.7  ALBUMIN 3.7    Recent Labs  11/19/15 1116 11/20/15 1005 11/21/15 0554 11/22/15 0851 11/23/15 0534 11/27/15 0720  WBC 15.8* 9.2 12.3* 7.4 7.6 9.2  NEUTROABS 11.8* 6.3 8.2*  --   --   --   HGB 13.3 7.9* 11.8* 10.9* 11.1* 12.1  HCT 43.7 33.0* 39.0 35.0* 35.3* 38.1  MCV 100.0 99.4 97.3 95.1 93.9 94.5  PLT 257 117* 229 203 196 300   Lab Results  Component Value Date   TSH 2.308 11/19/2015   No results found for: HGBA1C No results found for: CHOL, HDL, LDLCALC, LDLDIRECT, TRIG, CHOLHDL  Significant Diagnostic Results in last 30 days:  No results found.  Assessment/Plan  Failure to thrive with history of end-stage dementia-patient continues to decline-I did discuss this with her husband as well as with his daughter-at this point her husband would desire to  continue the Rocephin for a few more days and we will do this --family is aware of patient's poor prognosis-and her husband is agreeable to having a hospice consult-.  Also will change Tylenol to suppository form if needed-also will start Ativan 0.25 mg every 8 hours by mouth or sublingual for any anxiety.  I did discuss possibility we may have to titrate up her anxiety medications as well as pain medicines as her condition changes.  At this point will monitor.  [id number]      London Sheer, CMA [phone]

## 2016-09-23 ENCOUNTER — Other Ambulatory Visit: Payer: Self-pay | Admitting: *Deleted

## 2016-09-23 MED ORDER — MORPHINE SULFATE (CONCENTRATE) 20 MG/ML PO SOLN: ORAL | 0 refills | Status: AC

## 2016-09-23 MED ORDER — LORAZEPAM 2 MG/ML PO CONC: ORAL | 0 refills | Status: AC

## 2016-09-29 NOTE — Telephone Encounter (Signed)
[facility] #[phone] Fax: [phone]   

## 2016-09-29 DEATH — deceased

## 2018-01-18 IMAGING — DX DG ABDOMEN 1V
1 series · 1 of 1 positions shown · non-contrast
Comparison: None.

CLINICAL DATA: Failure to thrive

EXAM:
ABDOMEN - 1 VIEW

[abdomen kub]
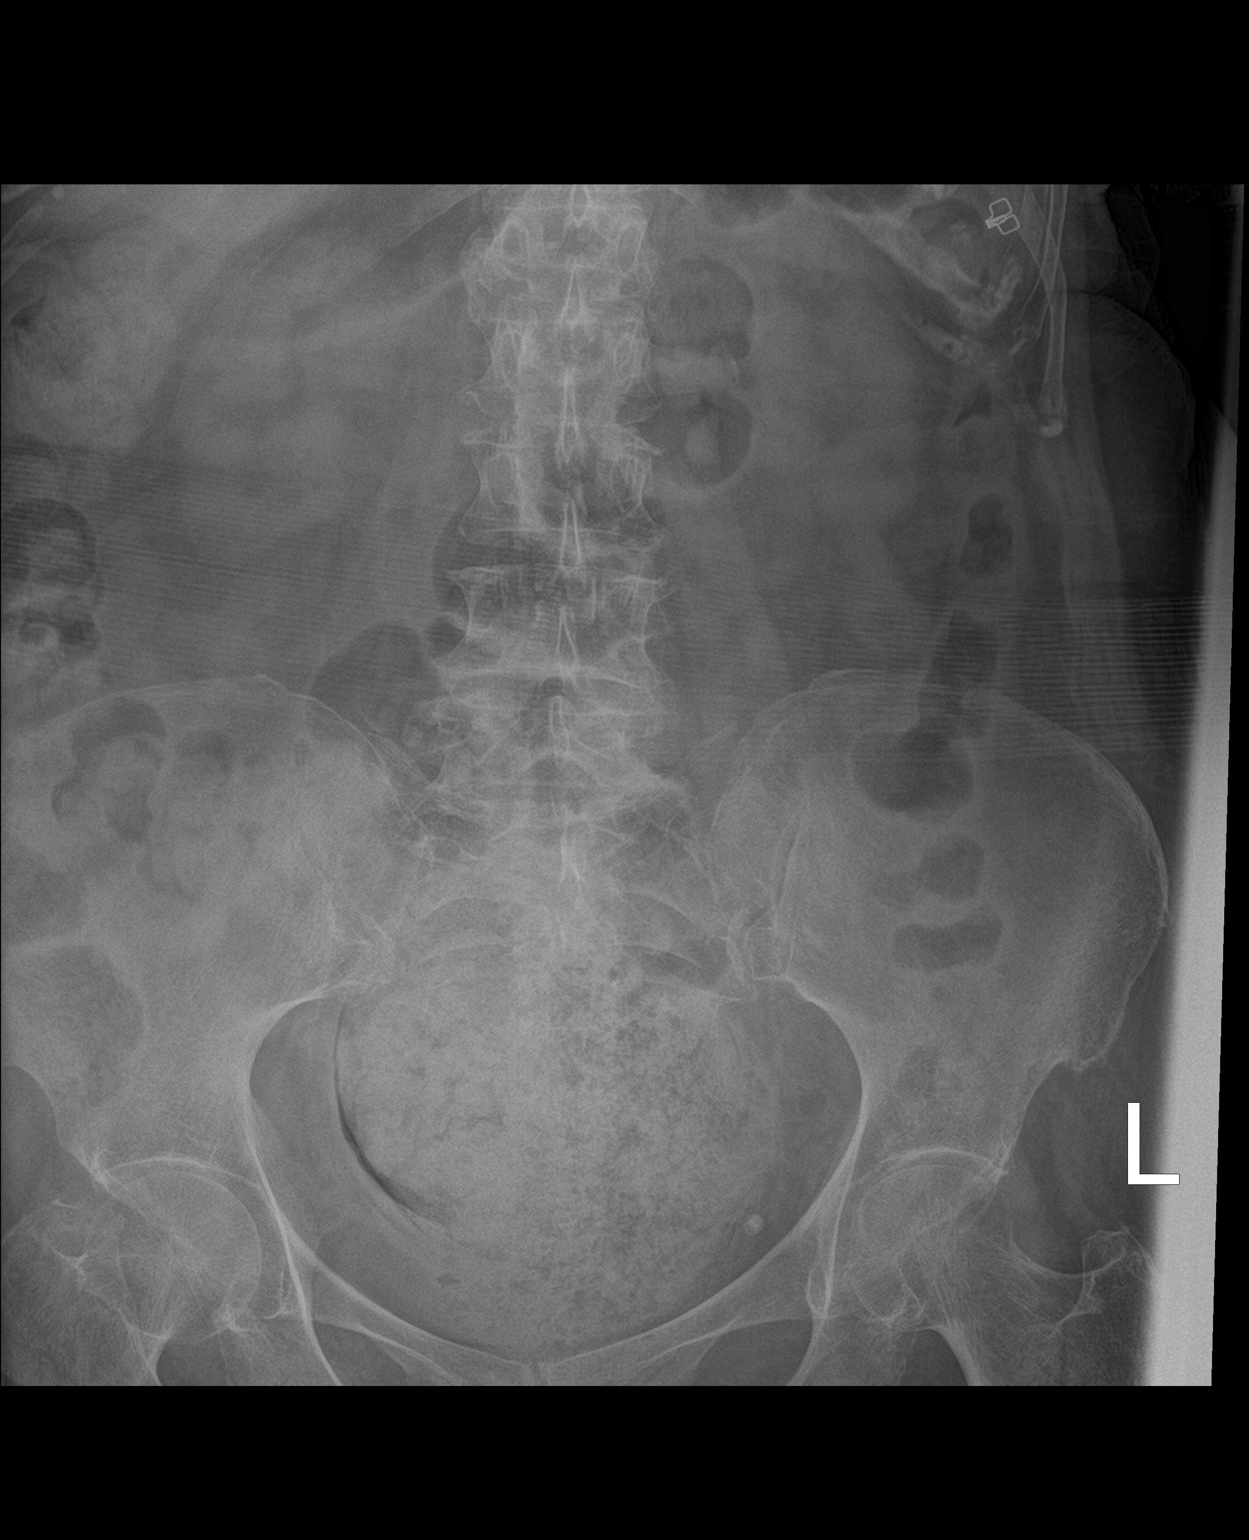

[1 of 1 positions shown; findings below may reference images not displayed]

FINDINGS: A supine film of the abdomen shows no bowel obstruction. There is a
moderate to large amount of feces particularly in the rectosigmoid
colon which may indicate fecal impaction. No opaque calculi are
seen. The bones are osteopenic.
IMPRESSION: Significant amount of feces within the rectum suspicious for fecal
impaction. No bowel obstruction.

## 2018-01-18 IMAGING — DX DG CHEST 1V
1 series · 1 of 1 positions shown · non-contrast
Comparison: None.

CLINICAL DATA: Failure to thrive

EXAM:
CHEST 1 VIEW

[chest ap]
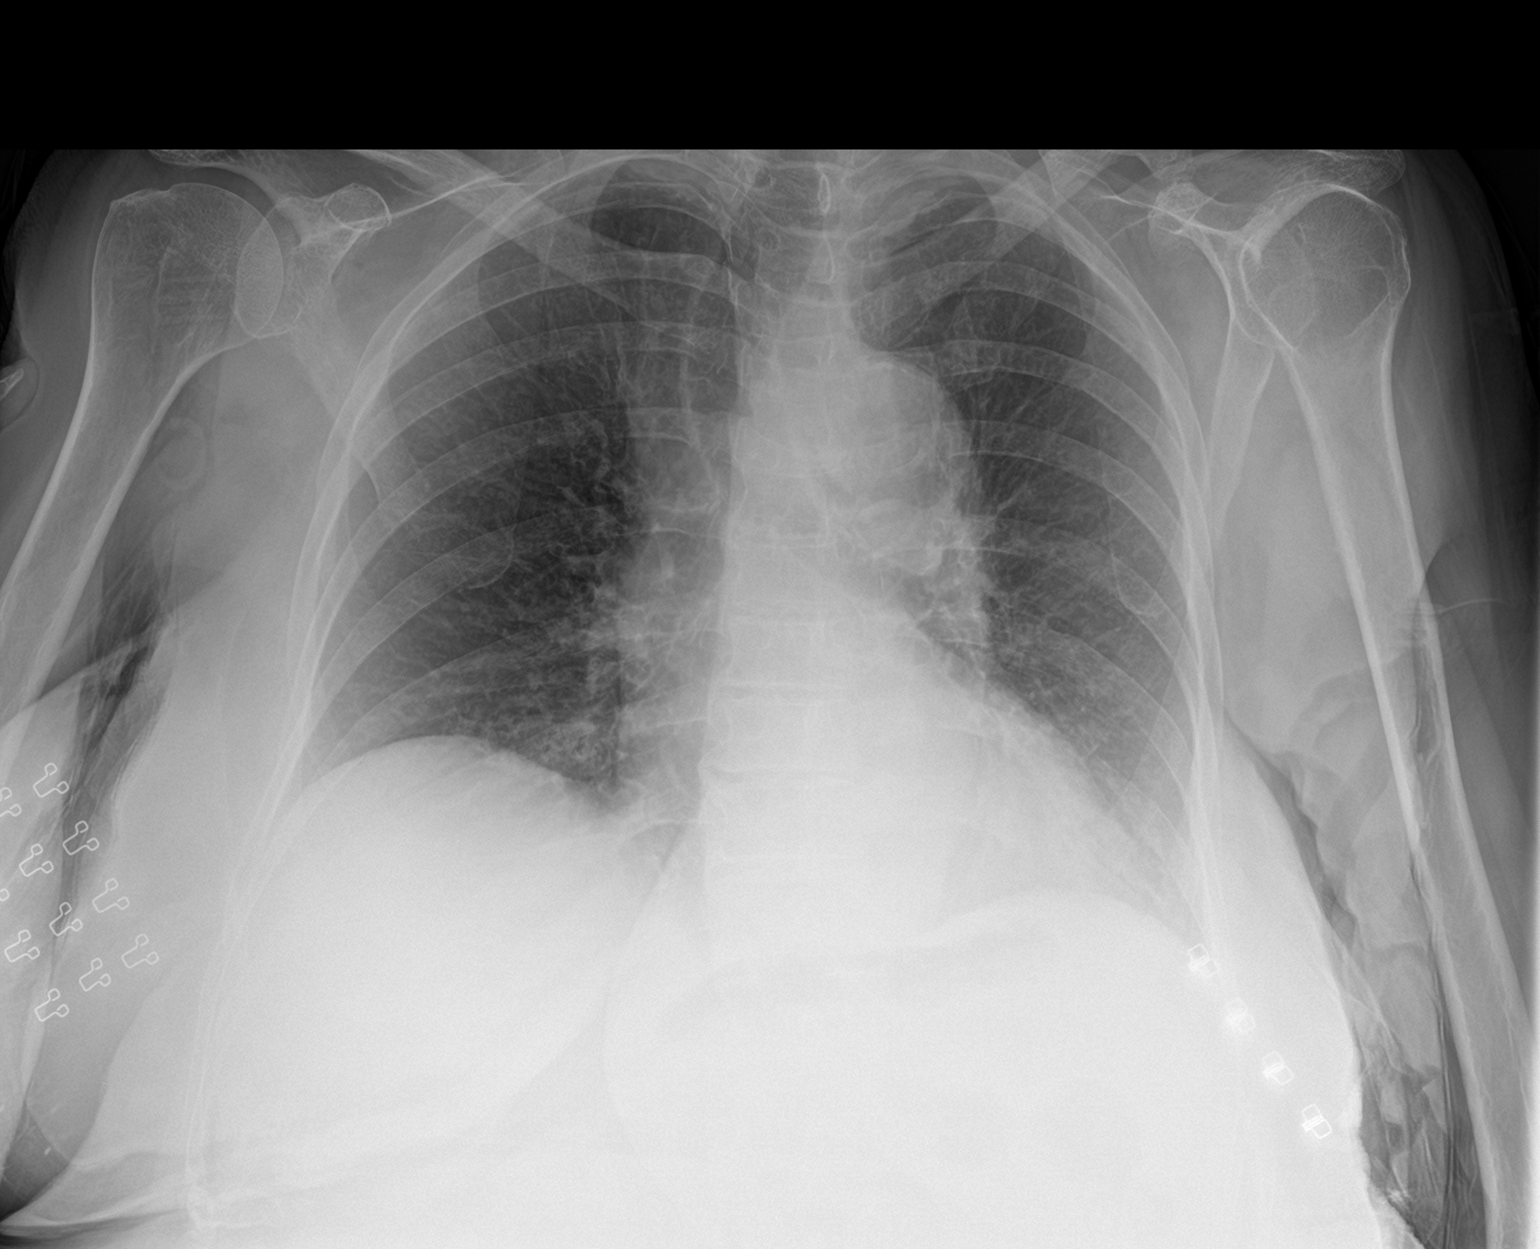

[1 of 1 positions shown; findings below may reference images not displayed]

FINDINGS: And AP sitting erect view of the chest shows no focal infiltrate or
effusion. Mediastinal and hilar contours are unremarkable. The heart
is within upper limits of normal. No bony abnormality is seen.
IMPRESSION: No active disease.

## 2018-01-20 IMAGING — CR DG CHEST 1V PORT
1 series · 1 of 1 positions shown · non-contrast
Comparison: 11/19/2015.

CLINICAL DATA: PICC line placement

EXAM:
PORTABLE CHEST 1 VIEW

[ap portable]
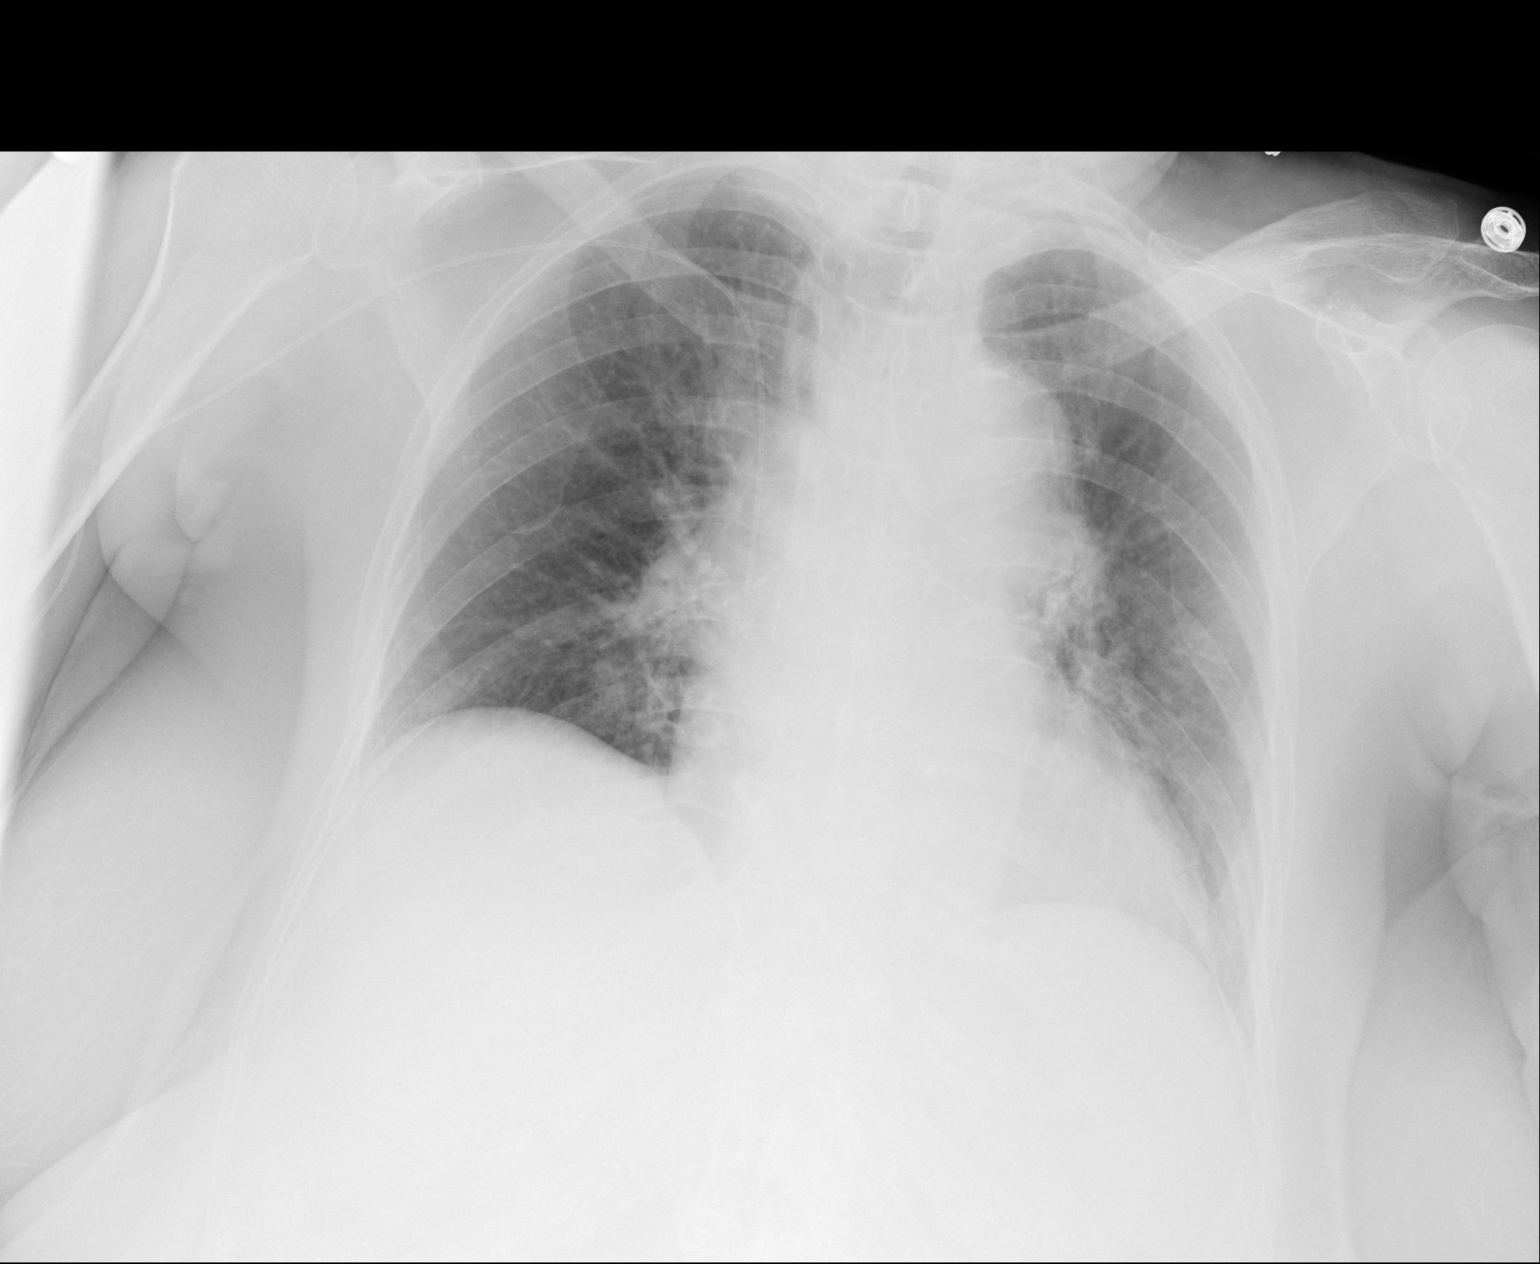

[1 of 1 positions shown; findings below may reference images not displayed]

FINDINGS: Cardiomegaly. Tortuous aorta. PICC line from RIGHT arm approach lies
at cavoatrial junction.

Low lung volumes with accentuate pulmonary markings but there are no
active infiltrates or failure.
IMPRESSION: Satisfactory PICC line placement with the tip at the cavoatrial
junction.
